# Patient Record
Sex: Female | Born: 1960 | Race: White | Hispanic: No | Marital: Married | Smoking: Never smoker
Health system: Southern US, Community
[De-identification: ages and names within clinical notes are randomized; demographics above are authoritative.]

## PROBLEM LIST (undated history)

## (undated) DIAGNOSIS — T4145XA Adverse effect of unspecified anesthetic, initial encounter: Secondary | ICD-10-CM

## (undated) DIAGNOSIS — E039 Hypothyroidism, unspecified: Secondary | ICD-10-CM

## (undated) DIAGNOSIS — T8859XA Other complications of anesthesia, initial encounter: Secondary | ICD-10-CM

## (undated) DIAGNOSIS — G43909 Migraine, unspecified, not intractable, without status migrainosus: Secondary | ICD-10-CM

## (undated) HISTORY — DX: Hypothyroidism, unspecified: E03.9

## (undated) HISTORY — PX: KNEE SURGERY: SHX244

## (undated) HISTORY — DX: Migraine, unspecified, not intractable, without status migrainosus: G43.909

## (undated) HISTORY — PX: TOTAL ABDOMINAL HYSTERECTOMY: SHX209

## (undated) HISTORY — PX: PILONIDAL CYST EXCISION: SHX744

## (undated) HISTORY — PX: CHOLECYSTECTOMY: SHX55

---

## 2007-10-19 ENCOUNTER — Encounter (HOSPITAL_COMMUNITY): Admission: RE | Admit: 2007-10-19 | Discharge: 2007-11-18 | Payer: Self-pay | Admitting: Endocrinology

## 2011-07-14 ENCOUNTER — Other Ambulatory Visit: Payer: Self-pay | Admitting: Neurology

## 2011-07-20 ENCOUNTER — Ambulatory Visit
Admission: RE | Admit: 2011-07-20 | Discharge: 2011-07-20 | Disposition: A | Discharge status: Home / Self Care | Payer: No Typology Code available for payment source | Source: Ambulatory Visit | Attending: Neurology | Admitting: Neurology

## 2011-07-20 DIAGNOSIS — R519 Headache, unspecified: Secondary | ICD-10-CM

## 2011-10-24 ENCOUNTER — Encounter (INDEPENDENT_AMBULATORY_CARE_PROVIDER_SITE_OTHER): Payer: Self-pay | Admitting: *Deleted

## 2011-12-23 ENCOUNTER — Ambulatory Visit (INDEPENDENT_AMBULATORY_CARE_PROVIDER_SITE_OTHER): Payer: No Typology Code available for payment source | Admitting: Internal Medicine

## 2011-12-23 ENCOUNTER — Other Ambulatory Visit (INDEPENDENT_AMBULATORY_CARE_PROVIDER_SITE_OTHER): Payer: Self-pay | Admitting: *Deleted

## 2011-12-23 ENCOUNTER — Telehealth (INDEPENDENT_AMBULATORY_CARE_PROVIDER_SITE_OTHER): Payer: Self-pay | Admitting: *Deleted

## 2011-12-23 ENCOUNTER — Encounter (INDEPENDENT_AMBULATORY_CARE_PROVIDER_SITE_OTHER): Payer: Self-pay | Admitting: Internal Medicine

## 2011-12-23 VITALS — BP 94/60 | HR 72 | Temp 98.3°F | Ht 66.5 in | Wt 161.7 lb

## 2011-12-23 DIAGNOSIS — E038 Other specified hypothyroidism: Secondary | ICD-10-CM | POA: Insufficient documentation

## 2011-12-23 DIAGNOSIS — R198 Other specified symptoms and signs involving the digestive system and abdomen: Secondary | ICD-10-CM

## 2011-12-23 DIAGNOSIS — R194 Change in bowel habit: Secondary | ICD-10-CM | POA: Insufficient documentation

## 2011-12-23 DIAGNOSIS — Z1211 Encounter for screening for malignant neoplasm of colon: Secondary | ICD-10-CM

## 2011-12-23 DIAGNOSIS — G43909 Migraine, unspecified, not intractable, without status migrainosus: Secondary | ICD-10-CM | POA: Insufficient documentation

## 2011-12-23 DIAGNOSIS — R1013 Epigastric pain: Secondary | ICD-10-CM | POA: Insufficient documentation

## 2011-12-23 DIAGNOSIS — K6289 Other specified diseases of anus and rectum: Secondary | ICD-10-CM

## 2011-12-23 MED ORDER — PEG-KCL-NACL-NASULF-NA ASC-C 100 G PO SOLR: 1.0000 | Freq: Once | ORAL | Status: DC

## 2011-12-23 MED ORDER — SUCRALFATE 1 GM/10ML PO SUSP: 1.0000 g | Freq: Four times a day (QID) | ORAL | Status: DC

## 2011-12-23 NOTE — Telephone Encounter (Signed)
Patient needs movi prep 

## 2011-12-23 NOTE — Progress Notes (Signed)
Subjective:     Patient ID: Patricia Glover, female   DOB: 01-18-1961, 51 y.o.   MRN: [id number]  HPI Referral from Dr. Sherril Croon for blood in her stool and and abdominal pain.  She states she had bloody diarrhea Friday and Saturday. Stools are ribbony. Stools are in small amounts. She also c/o epigastric pain 1 week ago. She underwent a CT scan of abdomen and pelvis for her abdominal pain (please see below).  She was started on Cipro and Flagyl for possible diverticulitis.   She has been treated for an ulcer in the past.  She started Carafate and the Carafate but this has not helped her epigastric pain.  She has a burning sensation in her epigastric region.  She has not seen any blood recently.  On rectal exam by K. Hairfield FNP she had a bloody mucous drainage. Appetite good. She thinks she may have lost 3-4 pounds with this episode.  Lower abdominal pain when she bend over.   EGD over 20 yrs ago by Dr. Cleotis Nipper and was told her stomach was slow emptying. She has never undergone a colonoscopy in the past.   12/19/2011: WBC ct 5.7.   H and H 13.4 and 38.8, MCV 95.8.  12/19/2011 CT abdomen and pelvis with CM:  (bilateral lower abdominal pain and rectal bleeding).  Minimal patchy opacity in the left lower lobe, suspicious for minimal pneumonia.  No avute abnormality in the abdomen or pelvis. Stable tiny non obstructing left renal calculus.  She tells me her grandmother had either esophageal or stomach cancer.   Review of Systems see hpi  Current Outpatient Prescriptions  Medication Sig Dispense Refill  . ciprofloxacin (CIPRO) 500 MG tablet Take 500 mg by mouth 2 (two) times daily.      . cloNIDine (CATAPRES) 0.1 MG tablet Take 0.1 mg by mouth 2 (two) times daily.      . cyclobenzaprine (FLEXERIL) 10 MG tablet Take 10 mg by mouth at bedtime as needed and may repeat dose one time if needed.      Marland Kitchen dexlansoprazole (DEXILANT) 60 MG capsule Take 60 mg by mouth daily.      . divalproex (DEPAKOTE ER) 500 MG 24  hr tablet Take 500 mg by mouth daily.      . DULoxetine (CYMBALTA) 60 MG capsule Take 60 mg by mouth daily.      Marland Kitchen ketorolac (TORADOL) 10 MG tablet Take 10 mg by mouth every 6 (six) hours as needed.      Marland Kitchen levothyroxine (SYNTHROID, LEVOTHROID) 150 MCG tablet Take 150 mcg by mouth daily.      . metroNIDAZOLE (FLAGYL) 500 MG tablet Take 500 mg by mouth 3 (three) times daily.      Marland Kitchen oxyCODONE-acetaminophen (PERCOCET) 10-325 MG per tablet Take 1 tablet by mouth every 4 (four) hours as needed.      . sucralfate (CARAFATE) 1 GM/10ML suspension Take 10 mLs (1 g total) by mouth 4 (four) times daily.  420 mL  1  . SUMAtriptan (IMITREX) 100 MG tablet Take 100 mg by mouth every 2 (two) hours as needed.      Marland Kitchen DISCONTD: sucralfate (CARAFATE) 1 GM/10ML suspension Take 1 g by mouth 4 (four) times daily.      Marland Kitchen dexlansoprazole (DEXILANT) 60 MG capsule Take 60 mg by mouth daily.      . sucralfate (CARAFATE) 1 GM/10ML suspension Take 10 mLs (1 g total) by mouth 4 (four) times daily.  420 mL  1  Past Medical History  Diagnosis Date  . Hypothyroid   . Migraines    Past Surgical History  Procedure Date  . Pilonidal cyst excision     35 yrs ago  . Cholecystectomy   . Knee surgery   . Total abdominal hysterectomy    History   Social History  . Marital Status: Married    Spouse Name: N/A    Number of Children: N/A  . Years of Education: N/A   Occupational History  . Not on file.   Social History Main Topics  . Smoking status: Never Smoker   . Smokeless tobacco: Not on file  . Alcohol Use: No  . Drug Use: No  . Sexually Active: Not on file   Other Topics Concern  . Not on file   Social History Narrative  . No narrative on file   Family Status  Relation Status Death Age  . Mother Alive     Thyroid disease, diverticulitis, depression  . Sister Alive     Good health   Allergies  Allergen Reactions  . Demerol (Meperidine)   . Morphine And Related        Objective:   Physical  Exam Filed Vitals:   12/23/11 1117  Height: 5' 6.5" (1.689 m)  Weight: 161 lb 11.2 oz (73.347 kg)   Alert and oriented. Skin warm and dry. Oral mucosa is moist.   . Sclera anicteric, conjunctivae is pink. Thyroid not enlarged. No cervical lymphadenopathy. Lungs clear. Heart regular rate and rhythm.  Abdomen is soft. Bowel sounds are positive. No hepatomegaly. No abdominal masses felt. Slight epigastric tenderness and slight tenderness rt lower quadrant. No edema to lower extremities.       Assessment:    Rectal bleeding. Diverticulitis ruled out on CT.  Possible hemorrhoidal bleed. Colonic neoplasm needs to be ruled out or polyp.  Patient has never undergone a colonoscopy in the past. She also c/o epigastric pain and a burning sensation. She says the Carafate and Dexiilant helped some but she continues to have the burning sensation. PUD needs to be ruled out.    Plan:    EGD and colonoscopy with Dr. Karilyn Cota. The risks and benefits such as perforation, bleeding, and infection were reviewed with the patient and is agreeable.

## 2011-12-23 NOTE — Patient Instructions (Addendum)
Continue Carafate and Dexilant. EGD and colonoscopy with Dr. Karilyn Cota. Finish Cipro and Flagyl

## 2011-12-26 ENCOUNTER — Encounter (HOSPITAL_COMMUNITY): Payer: Self-pay | Admitting: Pharmacy Technician

## 2012-01-02 ENCOUNTER — Encounter (INDEPENDENT_AMBULATORY_CARE_PROVIDER_SITE_OTHER): Payer: Self-pay

## 2012-01-07 MED ORDER — SODIUM CHLORIDE 0.45 % IV SOLN
Freq: Once | INTRAVENOUS | Status: AC
  Administered 2012-01-08: 1000 mL via INTRAVENOUS

## 2012-01-08 ENCOUNTER — Encounter (HOSPITAL_COMMUNITY): Payer: Self-pay | Admitting: *Deleted

## 2012-01-08 ENCOUNTER — Encounter (HOSPITAL_COMMUNITY)
Admission: RE | Disposition: A | Discharge status: Home / Self Care | Payer: Self-pay | Source: Ambulatory Visit | Attending: Internal Medicine

## 2012-01-08 ENCOUNTER — Ambulatory Visit (HOSPITAL_COMMUNITY)
Admission: RE | Admit: 2012-01-08 | Discharge: 2012-01-08 | Disposition: A | Discharge status: Home / Self Care | Payer: PRIVATE HEALTH INSURANCE | Source: Ambulatory Visit | Attending: Internal Medicine | Admitting: Internal Medicine

## 2012-01-08 ENCOUNTER — Encounter (INDEPENDENT_AMBULATORY_CARE_PROVIDER_SITE_OTHER): Payer: Self-pay

## 2012-01-08 DIAGNOSIS — R197 Diarrhea, unspecified: Secondary | ICD-10-CM | POA: Insufficient documentation

## 2012-01-08 DIAGNOSIS — R1013 Epigastric pain: Secondary | ICD-10-CM

## 2012-01-08 DIAGNOSIS — K6289 Other specified diseases of anus and rectum: Secondary | ICD-10-CM

## 2012-01-08 DIAGNOSIS — R109 Unspecified abdominal pain: Secondary | ICD-10-CM | POA: Insufficient documentation

## 2012-01-08 DIAGNOSIS — K921 Melena: Secondary | ICD-10-CM

## 2012-01-08 DIAGNOSIS — K644 Residual hemorrhoidal skin tags: Secondary | ICD-10-CM

## 2012-01-08 DIAGNOSIS — Z8711 Personal history of peptic ulcer disease: Secondary | ICD-10-CM

## 2012-01-08 HISTORY — DX: Adverse effect of unspecified anesthetic, initial encounter: T41.45XA

## 2012-01-08 HISTORY — DX: Other complications of anesthesia, initial encounter: T88.59XA

## 2012-01-08 SURGERY — COLONOSCOPY WITH ESOPHAGOGASTRODUODENOSCOPY (EGD)
Anesthesia: Moderate Sedation

## 2012-01-08 MED ORDER — FENTANYL CITRATE 0.05 MG/ML IJ SOLN
INTRAMUSCULAR | Status: DC | PRN
  Administered 2012-01-08 (×3): 25 ug via INTRAVENOUS

## 2012-01-08 MED ORDER — MIDAZOLAM HCL 5 MG/5ML IJ SOLN
INTRAMUSCULAR | Status: AC
  Filled 2012-01-08: qty 10

## 2012-01-08 MED ORDER — STERILE WATER FOR IRRIGATION IR SOLN
Status: DC | PRN
  Administered 2012-01-08: 14:00:00

## 2012-01-08 MED ORDER — ALIGN PO CAPS: 1.0000 | ORAL_CAPSULE | Freq: Every day | ORAL | Status: AC

## 2012-01-08 MED ORDER — DICYCLOMINE HCL 10 MG PO CAPS: 10.0000 mg | ORAL_CAPSULE | Freq: Three times a day (TID) | ORAL | Status: DC

## 2012-01-08 MED ORDER — MIDAZOLAM HCL 5 MG/5ML IJ SOLN
INTRAMUSCULAR | Status: DC | PRN
  Administered 2012-01-08 (×2): 2 mg via INTRAVENOUS
  Administered 2012-01-08: 1 mg via INTRAVENOUS
  Administered 2012-01-08 (×2): 2 mg via INTRAVENOUS

## 2012-01-08 MED ORDER — FENTANYL CITRATE 0.05 MG/ML IJ SOLN
INTRAMUSCULAR | Status: AC
  Filled 2012-01-08: qty 2

## 2012-01-08 MED ORDER — BUTAMBEN-TETRACAINE-BENZOCAINE 2-2-14 % EX AERO
INHALATION_SPRAY | CUTANEOUS | Status: DC | PRN
  Administered 2012-01-08: 2 via TOPICAL

## 2012-01-08 NOTE — Op Note (Signed)
EGD AND COLONOSCOPY  PROCEDURE REPORT  PATIENT:  Patricia Glover  MR#:  [id number] Birthdate:  Dec 07, 1960, 51 y.o., female Endoscopist:  Dr. Malissa Hippo, MD Referred By:  Dr. Ignatius Specking, MD Procedure Date: 01/08/2012  Procedure:   EGD & Colonoscopy  Indications:  Patient is 51 year old Caucasian female with recurrent upper and lower abdominal pain was not responded therapy. He had negative abdominopelvic CT. She's had prior cholecystectomy. She also gives history of dementing diarrhea and on one occasions she had 3 bloody bowel movements. She is undergoing diagnostic evaluation.            Informed Consent:  The risks, benefits, alternatives & imponderables which include, but are not limited to, bleeding, infection, perforation, drug reaction and potential missed lesion have been reviewed.  The potential for biopsy, lesion removal, esophageal dilation, etc. have also been discussed.  Questions have been answered.  All parties agreeable.  Please see history & physical in medical record for more information.  Medications:  Fentanyl 75 mcg  IV Versed 9 mg IV Cetacaine spray topically for oropharyngeal anesthesia  EGD  Description of procedure:  The endoscope was introduced through the mouth and advanced to the second portion of the duodenum without difficulty or limitations. The mucosal surfaces were surveyed very carefully during advancement of the scope and upon withdrawal.  Findings:  Esophagus:  Mucosa of the esophagus was normal. GE junction was unremarkable. GEJ:  40 cm Stomach:  Stomach was empty and distended very well with insufflation. Folds in the proximal stomach were normal. Examination of mucosa at body, antrum, pyloric channel, angularis fundus and cardia was normal. Duodenum:  Normal bulbar and post bulbar mucosa. Ampulla of water was also seen and appear to be normal.  Therapeutic/Diagnostic Maneuvers Performed:  None  COLONOSCOPY Description of procedure:  After a  digital rectal exam was performed, that colonoscope was advanced from the anus through the rectum and colon to the area of the cecum, ileocecal valve and appendiceal orifice. The cecum was deeply intubated. These structures were well-seen and photographed for the record. From the level of the cecum and ileocecal valve, the scope was slowly and cautiously withdrawn. The mucosal surfaces were carefully surveyed utilizing scope tip to flexion to facilitate fold flattening as needed. The scope was pulled down into the rectum where a thorough exam including retroflexion was performed. Terminal ileum was also examined.  Findings:   Prep excellent. Normal terminal ileum. Normal colonic mucosa throughout without evidence of diverticula colitis or polyps. Normal rectal mucosa. Small external hemorrhoids.  Therapeutic/Diagnostic Maneuvers Performed:  None  Complications:  None  Cecal Withdrawal Time:  8 minutes  Impression:  Normal esophagogastroduodenoscopy. Normal terminal ileoscopy and colonoscopy except external hemorrhoids.  Recommendations:  Dicyclomine 10 mg by mouth before each meal. Align 1 capsule by mouth daily. Small bowel follow-through.  Lisanne Ponce U  01/08/2012 2:09 PM  CC: Dr. Ignatius Specking., MD & Dr. Bonnetta Barry ref. provider found

## 2012-01-08 NOTE — H&P (Signed)
Patricia Glover is an 51 y.o. female.   Chief Complaint: Patient is here for esophagogastroduodenoscopy followed by colonoscopy. HPI: Patient is 51 year old Caucasian female who's been experiencing upper Patricia Glover loss few months. She had abdominopelvic CT which is unremarkable. She results of value in her gynecologist in evaluation is negative. She's been treated with PPI for over a month but did not feel any better. She has noted some relief of Carafate. She also gives history of rectal bleeding and diarrhea which lasted 1 day. She had 3 episodes of bloody bowel movements. She takes Toradol occasionally for migraine.  Past Medical History  Diagnosis Date  . Hypothyroid   . Migraines   . Complication of anesthesia     nausea, vomiting    Past Surgical History  Procedure Date  . Pilonidal cyst excision     35 yrs ago  . Cholecystectomy   . Knee surgery   . Total abdominal hysterectomy     History reviewed. No pertinent family history. Social History:  reports that she has never smoked. She does not have any smokeless tobacco history on file. She reports that she drinks alcohol. She reports that she does not use illicit drugs.  Allergies:  Allergies  Allergen Reactions  . Demerol (Meperidine) Nausea And Vomiting  . Morphine And Related Nausea And Vomiting    Medications Prior to Admission  Medication Sig Dispense Refill  . cloNIDine (CATAPRES) 0.1 MG tablet Take 0.1 mg by mouth at bedtime.       . divalproex (DEPAKOTE) 250 MG DR tablet Take 500-750 mg by mouth 2 (two) times daily. Take 2 tablets in the morning and 3 tablets in the evening      . DULoxetine (CYMBALTA) 60 MG capsule Take 60 mg by mouth every morning.       Marland Kitchen ketorolac (TORADOL) 10 MG tablet Take 10 mg by mouth every 6 (six) hours as needed. For migraines      . levothyroxine (SYNTHROID, LEVOTHROID) 150 MCG tablet Take 150 mcg by mouth daily before breakfast.       . Melatonin 5 MG TABS Take 1 tablet by mouth at  bedtime.      . ondansetron (ZOFRAN) 4 MG tablet Take 4 mg by mouth every 8 (eight) hours as needed. For nausea with migraines      . oxyCODONE-acetaminophen (PERCOCET) 10-325 MG per tablet Take 1 tablet by mouth every 4 (four) hours as needed. For severe migraines      . peg 3350 powder (MOVIPREP) 100 G SOLR Take 1 kit (100 g total) by mouth once.  1 kit  0  . sucralfate (CARAFATE) 1 GM/10ML suspension Take 10 mLs (1 g total) by mouth 4 (four) times daily.  420 mL  1  . SUMAtriptan (IMITREX) 100 MG tablet Take 100 mg by mouth every 2 (two) hours as needed. For headaches      . SUMAtriptan (IMITREX) 6 MG/0.5ML SOLN injection Inject 6 mg into the skin every 2 (two) hours as needed. For migraines        No results found for this or any previous visit (from the past 48 hour(s)). No results found.  ROS  Blood pressure 151/94, pulse 72, temperature 97.8 F (36.6 C), temperature source Oral, resp. rate 19, SpO2 97.00%. Physical Exam  Constitutional: She appears well-developed and well-nourished.  HENT:  Mouth/Throat: Oropharynx is clear and moist.  Eyes: Conjunctivae are normal. No scleral icterus.  Neck: No thyromegaly present.  Cardiovascular: Normal rate, regular  rhythm and normal heart sounds.   No murmur heard. Respiratory: Effort normal and breath sounds normal.  GI: She exhibits no distension and no mass. There is tenderness (mild tenderness at right mid abdomen).  Musculoskeletal: She exhibits no edema.  Lymphadenopathy:    She has no cervical adenopathy.  Neurological: She is alert.  Skin: Skin is warm.     Assessment/Plan Upper and lower abdominal pain. History of for bloody diarrhea. Diagnostic EGD and colonoscopy.  Patricia Glover 01/08/2012, 1:25 PM

## 2012-02-27 DIAGNOSIS — R079 Chest pain, unspecified: Secondary | ICD-10-CM

## 2012-12-20 ENCOUNTER — Other Ambulatory Visit: Payer: Self-pay | Admitting: *Deleted

## 2012-12-20 MED ORDER — LEVOTHYROXINE SODIUM 150 MCG PO TABS: 150.0000 ug | ORAL_TABLET | Freq: Every day | ORAL | Status: DC

## 2013-01-04 ENCOUNTER — Other Ambulatory Visit: Payer: Self-pay | Admitting: *Deleted

## 2013-01-04 DIAGNOSIS — E039 Hypothyroidism, unspecified: Secondary | ICD-10-CM

## 2013-01-12 ENCOUNTER — Ambulatory Visit: Payer: PRIVATE HEALTH INSURANCE | Admitting: Endocrinology

## 2013-01-20 ENCOUNTER — Ambulatory Visit: Payer: PRIVATE HEALTH INSURANCE | Admitting: Endocrinology

## 2013-01-20 DIAGNOSIS — Z0289 Encounter for other administrative examinations: Secondary | ICD-10-CM

## 2013-03-31 ENCOUNTER — Other Ambulatory Visit: Payer: Self-pay | Admitting: Endocrinology

## 2013-04-04 ENCOUNTER — Telehealth: Payer: Self-pay | Admitting: Endocrinology

## 2013-04-04 NOTE — Telephone Encounter (Signed)
Faxed again for the second time.

## 2013-04-13 ENCOUNTER — Other Ambulatory Visit: Payer: Self-pay | Admitting: *Deleted

## 2013-04-13 ENCOUNTER — Ambulatory Visit (INDEPENDENT_AMBULATORY_CARE_PROVIDER_SITE_OTHER): Payer: PRIVATE HEALTH INSURANCE | Admitting: Endocrinology

## 2013-04-13 ENCOUNTER — Encounter: Payer: Self-pay | Admitting: Endocrinology

## 2013-04-13 VITALS — BP 138/90 | HR 66 | Temp 98.3°F | Resp 12 | Ht 66.0 in | Wt 171.2 lb

## 2013-04-13 DIAGNOSIS — L748 Other eccrine sweat disorders: Secondary | ICD-10-CM

## 2013-04-13 DIAGNOSIS — L749 Eccrine sweat disorder, unspecified: Secondary | ICD-10-CM | POA: Insufficient documentation

## 2013-04-13 DIAGNOSIS — L639 Alopecia areata, unspecified: Secondary | ICD-10-CM

## 2013-04-13 DIAGNOSIS — R635 Abnormal weight gain: Secondary | ICD-10-CM

## 2013-04-13 DIAGNOSIS — E038 Other specified hypothyroidism: Secondary | ICD-10-CM

## 2013-04-13 MED ORDER — CLONIDINE HCL 0.1 MG PO TABS: ORAL_TABLET | ORAL | Status: DC

## 2013-04-13 NOTE — Progress Notes (Signed)
Patient ID: Patricia Glover, female   DOB: [birth year], 52 y.o.   MRN: [id number]  Reason for Appointment:  Hypothyroidism, followup visit    History of Present Illness:   The hypothyroidism was first diagnosed in 2008  Initially had symptoms of weight gain of 15 pounds, increasing fatigue as well as some hoarseness and difficulty swallowing. She did not have any cold intolerance; does have chronic dry skin. She was diagnosed with secondary hypothyroidism because of low free T4 and normal TSH, baseline levels not available She was initially treated with Armour Thyroid, subsequently Synthroid and Cytomel and the last few years with Synthroid alone Generally has had good response to her treatment  Complaints are reported by the patient now are  hair loss for the last 4-5 months which is patchy and also associated some loss of eyebrows. Hair loss is somewhat intermittent  She is also very concerned about her difficulty losing weight. She thinks she has gained another 5-6 pounds in the last 3 months even after stopping her Depakote; had been gaining weight on this    Walks daily and she thinks her diet is fairly good  She also has intermittent fatigue but no cold sensitivity. Has some dry skin and splitting nails          The treatments that the patient has taken include Synthroid.          Compliance with the medical regimen has been as prescribed with taking the tablet in the morning before breakfast.  LABS: From her local lab free T4 is 1.1, upper limit 1.12  No results found for any previous visit.    Medication List       This list is accurate as of: 04/13/13  9:39 PM.  Always use your most recent med list.               cloNIDine 0.1 MG tablet  Commonly known as:  CATAPRES  Take 2 tablets at bedtime     divalproex 250 MG DR tablet  Commonly known as:  DEPAKOTE  Take 500-750 mg by mouth 2 (two) times daily. Take 2 tablets in the morning and 3 tablets in the evening      DULoxetine 60 MG capsule  Commonly known as:  CYMBALTA  Take 60 mg by mouth every morning.     ketorolac 10 MG tablet  Commonly known as:  TORADOL  Take 10 mg by mouth every 6 (six) hours as needed. For migraines     levETIRAcetam 750 MG tablet  Commonly known as:  KEPPRA  Take 750 mg by mouth every 12 (twelve) hours. 750 mg at night and 500 mg in the am     levothyroxine 150 MCG tablet  Commonly known as:  SYNTHROID, LEVOTHROID  TAKE 1 TABLET BY MOUTH DAILY BEFORE BREAKFAST     Melatonin 5 MG Tabs  Take 1 tablet by mouth at bedtime.     ondansetron 4 MG tablet  Commonly known as:  ZOFRAN  Take 4 mg by mouth every 8 (eight) hours as needed. For nausea with migraines     oxyCODONE-acetaminophen 10-325 MG per tablet  Commonly known as:  PERCOCET  Take 1 tablet by mouth every 4 (four) hours as needed. For severe migraines     peg 3350 powder 100 G Solr  Commonly known as:  MOVIPREP  Take 1 kit (100 g total) by mouth once.     sucralfate 1 GM/10ML suspension  Commonly known as:  CARAFATE  Take 10 mLs (1 g total) by mouth 4 (four) times daily.     SUMAtriptan 100 MG tablet  Commonly known as:  IMITREX  Take 100 mg by mouth every 2 (two) hours as needed. For headaches     SUMAtriptan 6 MG/0.5ML Soln injection  Commonly known as:  IMITREX  Inject 6 mg into the skin every 2 (two) hours as needed. For migraines        Allergies:  Allergies  Allergen Reactions  . Demerol [Meperidine] Nausea And Vomiting  . Morphine And Related Nausea And Vomiting    Past Medical History  Diagnosis Date  . Hypothyroid   . Migraines   . Complication of anesthesia     nausea, vomiting    Past Surgical History  Procedure Laterality Date  . Pilonidal cyst excision      35 yrs ago  . Cholecystectomy    . Knee surgery    . Total abdominal hysterectomy      No family history on file.  Social History:  reports that she has never smoked. She does not have any smokeless tobacco  history on file. She reports that she drinks alcohol. She reports that she does not use illicit drugs.  REVIEW Of SYSTEMS:    She has hot flushes and night sweats despite using clonidine 0.1 mg. She is having some sweating spells in the daytime also   Migraines now better controlled with using Keppra   BP at work varies, diastolic 75-82, higher with stress. Taking clonidine for hot flashes other than blood pressure   No history of diabetes   She has borderline vitamin D level    Examination:   BP 138/90  Pulse 66  Temp(Src) 98.3 F (36.8 C)  Resp 12  Ht 5\' 6"  (1.676 m)  Wt 171 lb 3.2 oz (77.656 kg)  BMI 27.65 kg/m2  SpO2 96%   GENERAL APPEARANCE:  well-looking. No  Alopecia evident Skin is normal, her nails appear fairly normal  FACE: No puffiness of face or hands      NECK: no thyromegaly.          NEUROLOGIC EXAM: DTRs 2+ bilaterally at biceps.    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 She should talk to a dermatologist about this  HOT flashes/sweating: She is having more symptoms at night and can try a higher dose of clonidine at bedtime since he tends to have high normal blood pressure readings   Treatment:   Continue same dosage before breakfast daily. Avoid taking any calcium or iron supplements with the thyroid supplement.   Increase clonidine to 0.2 mg at bedtime    Followup with neurologist and PCP as above  Additional 1000 units of vitamin D 3  daily  Takuma Cifelli 04/13/2013, 9:39 PM

## 2013-04-13 NOTE — Patient Instructions (Addendum)
Try Qsymia   Try 2 clonidine at night

## 2013-05-19 ENCOUNTER — Encounter: Payer: Self-pay | Admitting: Endocrinology

## 2013-06-02 ENCOUNTER — Other Ambulatory Visit: Payer: Self-pay | Admitting: *Deleted

## 2013-06-02 MED ORDER — LEVOTHYROXINE SODIUM 150 MCG PO TABS: 150.0000 ug | ORAL_TABLET | Freq: Every day | ORAL | Status: DC

## 2013-09-30 ENCOUNTER — Other Ambulatory Visit: Payer: Self-pay | Admitting: *Deleted

## 2013-09-30 MED ORDER — CLONIDINE HCL 0.1 MG PO TABS: ORAL_TABLET | ORAL | Status: DC

## 2013-09-30 MED ORDER — LEVOTHYROXINE SODIUM 150 MCG PO TABS
150.0000 ug | ORAL_TABLET | Freq: Every day | ORAL | Status: DC
Start: 2013-09-30 — End: 2013-12-27

## 2013-12-27 ENCOUNTER — Other Ambulatory Visit: Payer: Self-pay | Admitting: *Deleted

## 2013-12-27 MED ORDER — LEVOTHYROXINE SODIUM 150 MCG PO TABS: 150.0000 ug | ORAL_TABLET | Freq: Every day | ORAL | Status: DC

## 2013-12-27 MED ORDER — CLONIDINE HCL 0.1 MG PO TABS: ORAL_TABLET | ORAL | Status: DC

## 2014-02-01 ENCOUNTER — Other Ambulatory Visit: Payer: Self-pay | Admitting: Endocrinology

## 2014-02-17 ENCOUNTER — Encounter: Payer: Self-pay | Admitting: Endocrinology

## 2014-02-17 ENCOUNTER — Ambulatory Visit (INDEPENDENT_AMBULATORY_CARE_PROVIDER_SITE_OTHER): Payer: PRIVATE HEALTH INSURANCE | Admitting: Endocrinology

## 2014-02-17 VITALS — BP 122/78 | HR 88 | Temp 98.5°F | Resp 12 | Wt 175.6 lb

## 2014-02-17 DIAGNOSIS — L748 Other eccrine sweat disorders: Secondary | ICD-10-CM

## 2014-02-17 DIAGNOSIS — E038 Other specified hypothyroidism: Secondary | ICD-10-CM

## 2014-02-17 DIAGNOSIS — L749 Eccrine sweat disorder, unspecified: Secondary | ICD-10-CM

## 2014-02-17 NOTE — Progress Notes (Signed)
Patient ID: Patricia Glover, female   DOB: [birth year], 53 y.o.   MRN: [id number]   Reason for Appointment:  Hypothyroidism, followup visit    History of Present Illness:   The hypothyroidism was first diagnosed in 2008  Initially had symptoms of weight gain of 15 pounds, increasing fatigue as well as some hoarseness and difficulty swallowing. She did not have any cold intolerance; does have chronic dry skin. She was diagnosed with secondary hypothyroidism because of low free T4 and normal TSH, baseline levels not available She was initially treated with Armour Thyroid, subsequently Synthroid and Cytomel and the last few years with Synthroid alone Generally has had good response to her treatment  Recent history:  Complaints are reported by the patient now are none She did have some difficulty with hair loss previously but this is better now without any intervention She has had some stress being a caregiver and does get some fatigue at times Overall feels fairly good She does have some difficulty losing weight but this is not new  She also has no cold sensitivity.  Hot at times, probably related to menopausal symptoms     She is quite compliant with taking her Synthroid in the morning consistently No recent labs available  Her previous free T4 = 1.1, upper limit 1.12  No results found for any previous visit.    Medication List       This list is accurate as of: 02/17/14  4:22 PM.  Always use your most recent med list.               cloNIDine 0.1 MG tablet  Commonly known as:  CATAPRES  TAKE 2 TABLETS BY MOUTH AT BEDTIME - ONE MONTH REFILL ONLY. PATIENT PAST DUE FOR OFFICE VISIT     divalproex 250 MG DR tablet  Commonly known as:  DEPAKOTE  Take 500-750 mg by mouth 2 (two) times daily. Take 2 tablets in the morning and 3 tablets in the evening     DULoxetine 60 MG capsule  Commonly known as:  CYMBALTA  Take 60 mg by mouth every morning.     ketorolac 10 MG tablet   Commonly known as:  TORADOL  Take 10 mg by mouth every 6 (six) hours as needed. For migraines     levETIRAcetam 750 MG tablet  Commonly known as:  KEPPRA  Take 750 mg by mouth every 12 (twelve) hours. 750 mg at night and 500 mg in the am     levothyroxine 150 MCG tablet  Commonly known as:  SYNTHROID, LEVOTHROID  TAKE 1 TABLET BY MOUTH EVERY DAY BEFORE BREAKFAST - ONE MONTH REFILL ONLY, PATIENT PAST DUE FOR OFFICE VISIT     Melatonin 5 MG Tabs  Take 1 tablet by mouth at bedtime.     ondansetron 4 MG tablet  Commonly known as:  ZOFRAN  Take 4 mg by mouth every 8 (eight) hours as needed. For nausea with migraines     oxyCODONE-acetaminophen 10-325 MG per tablet  Commonly known as:  PERCOCET  Take 1 tablet by mouth every 4 (four) hours as needed. For severe migraines     peg 3350 powder 100 G Solr  Commonly known as:  MOVIPREP  Take 1 kit (100 g total) by mouth once.     sucralfate 1 GM/10ML suspension  Commonly known as:  CARAFATE  Take 10 mLs (1 g total) by mouth 4 (four) times daily.     SUMAtriptan 100 MG tablet  Commonly known as:  IMITREX  Take 100 mg by mouth every 2 (two) hours as needed. For headaches     SUMAtriptan 6 MG/0.5ML Soln injection  Commonly known as:  IMITREX  Inject 6 mg into the skin every 2 (two) hours as needed. For migraines        Allergies:  Allergies  Allergen Reactions  . Demerol [Meperidine] Nausea And Vomiting  . Morphine And Related Nausea And Vomiting    Past Medical History  Diagnosis Date  . Hypothyroid   . Migraines   . Complication of anesthesia     nausea, vomiting    Past Surgical History  Procedure Laterality Date  . Pilonidal cyst excision      35 yrs ago  . Cholecystectomy    . Knee surgery    . Total abdominal hysterectomy      History reviewed. No pertinent family history.  Social History:  reports that she has never smoked. She does not have any smokeless tobacco history on file. She reports that she  drinks alcohol. She reports that she does not use illicit drugs.  REVIEW Of SYSTEMS:    She has hot flushes and night sweats which are better since her last visit with using clonidine 0.2 mg now. She is having less sweating spells in the daytime also   Migraines now better controlled with using Keppra   BP at work normal usually, higher with stress. Taking clonidine for hot flashes other than blood pressure   No history of diabetes  Additional 1000 units of vitamin D 3 daily  recommended on her last visit  Weight history:  Wt Readings from Last 3 Encounters:  02/17/14 175 lb 9.6 oz (79.652 kg)  04/13/13 171 lb 3.2 oz (77.656 kg)  12/23/11 161 lb 11.2 oz (73.347 kg)      Examination:   BP 122/78  Pulse 88  Temp(Src) 98.5 F (36.9 C) (Oral)  Resp 12  Wt 175 lb 9.6 oz (79.652 kg)  SpO2 97%   GENERAL APPEARANCE: She looks well Skin is normal, no peripheral edema  FACE: No puffiness of face or hands      NECK: no thyromegaly.          NEUROLOGIC EXAM: DTRs 2+ bilaterally at biceps.    Assessments   Hypothyroidism, secondary with long-term replacement on  regimen of 150 mcg She is symptomatically doing fairly well and is compliant with her supplement  HOT flashes/sweating: She is having good control of the symptoms at night with 0.2 mg clonidine and is tolerating this well  Weight gain: She has not been noted to be compliant with her exercise and diet regimen and will resume   Treatment:   Check free T4 level to decide on Synthroid doses Continue current does of clonidine at night  Benelli Winther 02/17/2014, 4:22 PM

## 2014-02-21 LAB — T4, FREE: FREE T4: 1.16 ng/dL (ref 0.60–1.60)

## 2014-02-21 NOTE — Progress Notes (Signed)
Quick Note:  Please let patient know that the lab result is normal and no change needed ______ 

## 2014-02-27 ENCOUNTER — Other Ambulatory Visit: Payer: Self-pay | Admitting: *Deleted

## 2014-02-27 MED ORDER — CLONIDINE HCL 0.1 MG PO TABS: ORAL_TABLET | ORAL | Status: DC

## 2014-03-01 ENCOUNTER — Other Ambulatory Visit: Payer: Self-pay | Admitting: *Deleted

## 2014-03-01 MED ORDER — LEVOTHYROXINE SODIUM 150 MCG PO TABS: ORAL_TABLET | ORAL | Status: DC

## 2014-06-26 ENCOUNTER — Other Ambulatory Visit: Payer: Self-pay | Admitting: *Deleted

## 2014-06-26 MED ORDER — CLONIDINE HCL 0.1 MG PO TABS: ORAL_TABLET | ORAL | Status: DC

## 2014-12-28 ENCOUNTER — Other Ambulatory Visit: Payer: Self-pay | Admitting: *Deleted

## 2014-12-28 MED ORDER — CLONIDINE HCL 0.1 MG PO TABS: ORAL_TABLET | ORAL | Status: DC

## 2015-02-22 ENCOUNTER — Other Ambulatory Visit: Payer: Self-pay | Admitting: *Deleted

## 2015-02-22 MED ORDER — CLONIDINE HCL 0.1 MG PO TABS: ORAL_TABLET | ORAL | Status: DC

## 2015-02-22 MED ORDER — LEVOTHYROXINE SODIUM 150 MCG PO TABS: ORAL_TABLET | ORAL | Status: DC

## 2015-03-26 ENCOUNTER — Other Ambulatory Visit: Payer: Self-pay | Admitting: *Deleted

## 2016-02-21 ENCOUNTER — Ambulatory Visit (INDEPENDENT_AMBULATORY_CARE_PROVIDER_SITE_OTHER): Payer: PRIVATE HEALTH INSURANCE | Admitting: Otolaryngology

## 2016-02-21 DIAGNOSIS — K219 Gastro-esophageal reflux disease without esophagitis: Secondary | ICD-10-CM | POA: Diagnosis not present

## 2016-02-21 DIAGNOSIS — R490 Dysphonia: Secondary | ICD-10-CM

## 2019-05-31 ENCOUNTER — Other Ambulatory Visit: Payer: Self-pay

## 2019-05-31 ENCOUNTER — Ambulatory Visit: Payer: PRIVATE HEALTH INSURANCE | Attending: Internal Medicine

## 2019-05-31 DIAGNOSIS — Z20822 Contact with and (suspected) exposure to covid-19: Secondary | ICD-10-CM

## 2019-06-01 LAB — NOVEL CORONAVIRUS, NAA: SARS-CoV-2, NAA: NOT DETECTED

## 2019-10-31 ENCOUNTER — Other Ambulatory Visit: Payer: Self-pay | Admitting: Nurse Practitioner

## 2019-11-01 ENCOUNTER — Other Ambulatory Visit: Payer: Self-pay | Admitting: Nurse Practitioner

## 2019-12-15 ENCOUNTER — Ambulatory Visit (INDEPENDENT_AMBULATORY_CARE_PROVIDER_SITE_OTHER): Payer: 59

## 2019-12-15 ENCOUNTER — Ambulatory Visit
Admission: EM | Admit: 2019-12-15 | Discharge: 2019-12-15 | Disposition: A | Discharge status: Home / Self Care | Payer: 59

## 2019-12-15 DIAGNOSIS — S93491A Sprain of other ligament of right ankle, initial encounter: Secondary | ICD-10-CM

## 2019-12-15 DIAGNOSIS — M25571 Pain in right ankle and joints of right foot: Secondary | ICD-10-CM

## 2019-12-15 MED ORDER — TRAMADOL HCL 50 MG PO TABS: 50.0000 mg | ORAL_TABLET | Freq: Two times a day (BID) | ORAL | 0 refills | Status: DC | PRN

## 2019-12-15 NOTE — Discharge Instructions (Addendum)
X-rays negative for fracture or dislocation Continue conservative management of rest, ice, and elevate Cam walker applied Alternate ibuprofen and tylenol Tramadol for severe break-through pain.  DO NOT DRIVE OR OPERATE HEAVY MACHINERY WHILE TAKING as this medication may make you drowsy Follow up with PCP if symptoms persist Return or go to the ER if you have any new or worsening symptoms (fever, chills, chest pain, redness, swelling, bruising, deformity, worsening symptoms despite treatment, etc...)
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
## 2019-12-15 NOTE — ED Triage Notes (Signed)
Pt presents with right ankle  injury after stepping off steps last night, swelling noted

## 2020-02-21 ENCOUNTER — Other Ambulatory Visit: Payer: Self-pay | Admitting: Nurse Practitioner

## 2020-02-21 DIAGNOSIS — R32 Unspecified urinary incontinence: Secondary | ICD-10-CM | POA: Diagnosis not present

## 2020-02-21 DIAGNOSIS — Z1331 Encounter for screening for depression: Secondary | ICD-10-CM | POA: Diagnosis not present

## 2020-02-21 DIAGNOSIS — Z299 Encounter for prophylactic measures, unspecified: Secondary | ICD-10-CM | POA: Diagnosis not present

## 2020-02-21 DIAGNOSIS — Z0000 Encounter for general adult medical examination without abnormal findings: Secondary | ICD-10-CM | POA: Diagnosis not present

## 2020-02-21 DIAGNOSIS — I10 Essential (primary) hypertension: Secondary | ICD-10-CM | POA: Diagnosis not present

## 2020-02-21 DIAGNOSIS — Z6829 Body mass index (BMI) 29.0-29.9, adult: Secondary | ICD-10-CM | POA: Diagnosis not present

## 2020-02-21 DIAGNOSIS — E039 Hypothyroidism, unspecified: Secondary | ICD-10-CM | POA: Diagnosis not present

## 2020-02-21 DIAGNOSIS — R197 Diarrhea, unspecified: Secondary | ICD-10-CM | POA: Diagnosis not present

## 2020-02-22 DIAGNOSIS — E039 Hypothyroidism, unspecified: Secondary | ICD-10-CM | POA: Diagnosis not present

## 2020-02-22 DIAGNOSIS — Z0000 Encounter for general adult medical examination without abnormal findings: Secondary | ICD-10-CM | POA: Diagnosis not present

## 2020-02-22 DIAGNOSIS — Z79899 Other long term (current) drug therapy: Secondary | ICD-10-CM | POA: Diagnosis not present

## 2020-03-13 ENCOUNTER — Other Ambulatory Visit: Payer: Self-pay | Admitting: Nurse Practitioner

## 2020-04-10 ENCOUNTER — Other Ambulatory Visit: Payer: Self-pay | Admitting: Nurse Practitioner

## 2020-04-12 ENCOUNTER — Other Ambulatory Visit: Payer: Self-pay | Admitting: Nurse Practitioner

## 2020-04-24 ENCOUNTER — Other Ambulatory Visit: Payer: Self-pay

## 2020-04-24 ENCOUNTER — Ambulatory Visit: Payer: 59 | Attending: Internal Medicine | Admitting: Physical Therapy

## 2020-04-24 DIAGNOSIS — M79672 Pain in left foot: Secondary | ICD-10-CM | POA: Insufficient documentation

## 2020-04-24 NOTE — Therapy (Signed)
[facility] MAIN [facility] SERVICES [address] [place], [place], [address] Phone: [phone]   Fax:  [phone]  Physical Therapy Treatment  Patient Details  Name: Patricia Glover MRN: [id number] Date of Birth: [birth year] No data recorded  Encounter Date: 04/24/2020    Past Medical History:  Diagnosis Date  . Complication of anesthesia    nausea, vomiting  . Hypothyroid   . Migraines     Past Surgical History:  Procedure Laterality Date  . CHOLECYSTECTOMY    . KNEE SURGERY    . PILONIDAL CYST EXCISION     35 yrs ago  . TOTAL ABDOMINAL HYSTERECTOMY      There were no vitals filed for this visit.    PT/OT/SLP Screening Form   Time: in 1:15  Time out 1:45   Complaint _Patient foot hurts with a shoe on or if she is weight bearing or if she has been up on it. The pain began 2 weeks ago. It hurts after several hours of being up on her foot. Past Medical Hx: L ankle sprain many years ago  Injury Date:began 2 weeks ago Pain Scale: left ankle 6/10  Patient's phone number: [phone]  Hx (this occurrence): This patient reports that her left ankle started hurting 2 weeks ago from unknown origin. It hurts with walking/standing/or with a shoe on. She mostly stands for work.      Assessment: Tender to distal  5th metatarsal  L ankle DF weak -4/5 and painful, toe extension weak and tender ROM is WNL to left ankle       Recommendations:  Ice and MD referral  Comments:    [x]  Patient would benefit from an MD referral []  Patient would benefit from a full PT/OT/ SLP evaluation and treatment. [x]  No intervention recommended at this time.                                         Patient will benefit from skilled therapeutic intervention in order to improve the following deficits and impairments:     Visit Diagnosis: Pain in left foot     Problem List Patient Active Problem List    Diagnosis Date Noted  . Sweating abnormality 04/13/2013  . Alopecia areata 04/13/2013  . Epigastric pain 12/23/2011  . Encounter for diagnostic colonoscopy due to change in bowel habits 12/23/2011  . Migraines 12/23/2011  . 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

## 2020-05-25 ENCOUNTER — Ambulatory Visit: Payer: 59 | Attending: Internal Medicine

## 2020-05-25 DIAGNOSIS — Z23 Encounter for immunization: Secondary | ICD-10-CM

## 2020-05-25 NOTE — Progress Notes (Signed)
   Covid-19 Vaccination Clinic  Name:  MAYTHE DERAMO    MRN: [id number] DOB: Mar 29, 1961  05/25/2020  Ms. Smigelski was observed post Covid-19 immunization for 15 minutes without incident. She was provided with Vaccine Information Sheet and instruction to access the V-Safe system.   Ms. Avino was instructed to call 911 with any severe reactions post vaccine: Marland Kitchen Difficulty breathing  . Swelling of face and throat  . A fast heartbeat  . A bad rash all over body  . Dizziness and weakness   Immunizations Administered    No immunizations on file.

## 2020-05-31 DIAGNOSIS — F419 Anxiety disorder, unspecified: Secondary | ICD-10-CM | POA: Diagnosis not present

## 2020-05-31 DIAGNOSIS — Z299 Encounter for prophylactic measures, unspecified: Secondary | ICD-10-CM | POA: Diagnosis not present

## 2020-05-31 DIAGNOSIS — E039 Hypothyroidism, unspecified: Secondary | ICD-10-CM | POA: Diagnosis not present

## 2020-05-31 DIAGNOSIS — I10 Essential (primary) hypertension: Secondary | ICD-10-CM | POA: Diagnosis not present

## 2020-06-14 ENCOUNTER — Other Ambulatory Visit: Payer: Self-pay | Admitting: Internal Medicine

## 2020-06-14 DIAGNOSIS — Z299 Encounter for prophylactic measures, unspecified: Secondary | ICD-10-CM | POA: Diagnosis not present

## 2020-06-14 DIAGNOSIS — F419 Anxiety disorder, unspecified: Secondary | ICD-10-CM | POA: Diagnosis not present

## 2020-06-14 DIAGNOSIS — I10 Essential (primary) hypertension: Secondary | ICD-10-CM | POA: Diagnosis not present

## 2020-06-14 DIAGNOSIS — K219 Gastro-esophageal reflux disease without esophagitis: Secondary | ICD-10-CM | POA: Diagnosis not present

## 2020-06-14 DIAGNOSIS — R000 Tachycardia, unspecified: Secondary | ICD-10-CM | POA: Diagnosis not present

## 2020-08-02 ENCOUNTER — Other Ambulatory Visit: Payer: Self-pay | Admitting: Nurse Practitioner

## 2020-08-20 ENCOUNTER — Other Ambulatory Visit: Payer: Self-pay | Admitting: Nurse Practitioner

## 2020-08-20 DIAGNOSIS — Z1231 Encounter for screening mammogram for malignant neoplasm of breast: Secondary | ICD-10-CM

## 2020-09-26 ENCOUNTER — Other Ambulatory Visit: Payer: Self-pay

## 2020-09-26 MED FILL — Hydrochlorothiazide Tab 25 MG: ORAL | 30 days supply | Qty: 30 | Fill #0 | Status: AC

## 2020-09-26 MED FILL — Clonidine HCl Tab 0.1 MG: ORAL | 30 days supply | Qty: 60 | Fill #0 | Status: AC

## 2020-09-26 MED FILL — Losartan Potassium Tab 100 MG: ORAL | 30 days supply | Qty: 60 | Fill #0 | Status: AC

## 2020-09-26 MED FILL — Hydrochlorothiazide Tab 25 MG: ORAL | 30 days supply | Qty: 30 | Fill #0 | Status: CN

## 2020-09-26 MED FILL — Duloxetine HCl Enteric Coated Pellets Cap 60 MG (Base Eq): ORAL | 30 days supply | Qty: 90 | Fill #0 | Status: AC

## 2020-09-26 MED FILL — Levothyroxine Sodium Tab 88 MCG: ORAL | 90 days supply | Qty: 90 | Fill #0 | Status: AC

## 2020-10-16 ENCOUNTER — Other Ambulatory Visit: Payer: Self-pay

## 2020-10-16 ENCOUNTER — Ambulatory Visit
Admission: RE | Admit: 2020-10-16 | Discharge: 2020-10-16 | Disposition: A | Discharge status: Home / Self Care | Payer: 59 | Source: Ambulatory Visit | Attending: Nurse Practitioner | Admitting: Nurse Practitioner

## 2020-10-16 DIAGNOSIS — Z1231 Encounter for screening mammogram for malignant neoplasm of breast: Secondary | ICD-10-CM | POA: Diagnosis not present

## 2020-10-17 ENCOUNTER — Inpatient Hospital Stay
Admission: RE | Admit: 2020-10-17 | Discharge: 2020-10-17 | Disposition: A | Discharge status: Home / Self Care | Payer: Self-pay | Source: Ambulatory Visit | Attending: *Deleted | Admitting: *Deleted

## 2020-10-17 ENCOUNTER — Other Ambulatory Visit: Payer: Self-pay | Admitting: *Deleted

## 2020-10-17 DIAGNOSIS — Z299 Encounter for prophylactic measures, unspecified: Secondary | ICD-10-CM | POA: Diagnosis not present

## 2020-10-17 DIAGNOSIS — Z1231 Encounter for screening mammogram for malignant neoplasm of breast: Secondary | ICD-10-CM

## 2020-10-17 DIAGNOSIS — R5383 Other fatigue: Secondary | ICD-10-CM | POA: Diagnosis not present

## 2020-10-17 DIAGNOSIS — I10 Essential (primary) hypertension: Secondary | ICD-10-CM | POA: Diagnosis not present

## 2020-10-17 DIAGNOSIS — E039 Hypothyroidism, unspecified: Secondary | ICD-10-CM | POA: Diagnosis not present

## 2020-10-17 DIAGNOSIS — Z789 Other specified health status: Secondary | ICD-10-CM | POA: Diagnosis not present

## 2020-10-18 ENCOUNTER — Other Ambulatory Visit: Payer: Self-pay

## 2020-10-18 MED ORDER — LEVOTHYROXINE SODIUM 88 MCG PO TABS
88.0000 ug | ORAL_TABLET | Freq: Every day | ORAL | 2 refills | Status: DC
  Filled 2020-10-18 – 2021-01-03 (×3): qty 90, 90d supply, fill #0
  Filled 2021-04-11: qty 90, 90d supply, fill #1
  Filled 2021-07-11: qty 90, 90d supply, fill #2

## 2020-10-18 MED FILL — Metoprolol Succinate Tab ER 24HR 25 MG (Tartrate Equiv): ORAL | 30 days supply | Qty: 90 | Fill #0 | Status: AC

## 2020-11-06 ENCOUNTER — Other Ambulatory Visit: Payer: Self-pay

## 2020-11-06 MED ORDER — CLONIDINE HCL 0.1 MG PO TABS
ORAL_TABLET | ORAL | 2 refills | Status: DC
  Filled 2020-11-06: qty 60, 30d supply, fill #0
  Filled 2020-12-13: qty 60, 30d supply, fill #1
  Filled 2021-05-29: qty 60, 30d supply, fill #2

## 2020-11-06 MED ORDER — SCOPOLAMINE 1 MG/3DAYS TD PT72
MEDICATED_PATCH | TRANSDERMAL | 0 refills | Status: DC
  Filled 2020-11-06: qty 3, 9d supply, fill #0

## 2020-11-06 MED FILL — Metoprolol Succinate Tab ER 24HR 25 MG (Tartrate Equiv): ORAL | 30 days supply | Qty: 90 | Fill #1 | Status: AC

## 2020-11-06 MED FILL — Hydrochlorothiazide Tab 25 MG: ORAL | 30 days supply | Qty: 30 | Fill #1 | Status: AC

## 2020-11-06 MED FILL — Losartan Potassium Tab 100 MG: ORAL | 30 days supply | Qty: 60 | Fill #1 | Status: AC

## 2020-11-07 ENCOUNTER — Other Ambulatory Visit: Payer: Self-pay

## 2020-11-07 MED ORDER — DULOXETINE HCL 60 MG PO CPEP
ORAL_CAPSULE | ORAL | 1 refills | Status: DC
  Filled 2020-11-07: qty 90, 90d supply, fill #0
  Filled 2021-02-06: qty 90, 90d supply, fill #1

## 2020-11-13 ENCOUNTER — Other Ambulatory Visit: Payer: Self-pay

## 2020-12-13 ENCOUNTER — Other Ambulatory Visit: Payer: Self-pay | Admitting: Nurse Practitioner

## 2020-12-13 ENCOUNTER — Other Ambulatory Visit: Payer: Self-pay

## 2020-12-13 MED FILL — Losartan Potassium Tab 100 MG: ORAL | 30 days supply | Qty: 60 | Fill #2 | Status: AC

## 2020-12-13 MED FILL — Metoprolol Succinate Tab ER 24HR 25 MG (Tartrate Equiv): ORAL | 30 days supply | Qty: 90 | Fill #2 | Status: AC

## 2020-12-14 ENCOUNTER — Other Ambulatory Visit: Payer: Self-pay

## 2020-12-14 MED ORDER — CARESTART COVID-19 HOME TEST VI KIT
PACK | 0 refills | Status: DC
  Filled 2020-12-14: qty 2, 4d supply, fill #0

## 2020-12-18 ENCOUNTER — Other Ambulatory Visit: Payer: Self-pay

## 2020-12-19 ENCOUNTER — Other Ambulatory Visit: Payer: Self-pay

## 2020-12-20 ENCOUNTER — Other Ambulatory Visit: Payer: Self-pay

## 2020-12-20 MED ORDER — LOSARTAN POTASSIUM 100 MG PO TABS
ORAL_TABLET | ORAL | 12 refills | Status: DC
  Filled 2020-12-20: qty 60, 30d supply, fill #0
  Filled 2021-01-17: qty 60, 60d supply, fill #0
  Filled 2021-05-29: qty 60, 60d supply, fill #1
  Filled 2021-08-01: qty 60, 60d supply, fill #2
  Filled 2021-10-01: qty 60, 60d supply, fill #3
  Filled 2021-12-02: qty 60, 60d supply, fill #4

## 2020-12-20 MED ORDER — METOPROLOL SUCCINATE ER 25 MG PO TB24
ORAL_TABLET | ORAL | 6 refills | Status: DC
  Filled 2020-12-20 – 2021-01-17 (×2): qty 90, 30d supply, fill #0
  Filled 2021-03-26: qty 90, 30d supply, fill #1
  Filled 2021-04-29: qty 90, 30d supply, fill #2
  Filled 2021-05-29: qty 90, 30d supply, fill #3
  Filled 2021-06-26: qty 90, 30d supply, fill #4
  Filled 2021-08-01: qty 90, 30d supply, fill #5
  Filled 2021-10-01: qty 90, 30d supply, fill #6

## 2020-12-20 MED ORDER — HYDROCHLOROTHIAZIDE 25 MG PO TABS
ORAL_TABLET | ORAL | 3 refills | Status: DC
  Filled 2020-12-20: qty 30, 30d supply, fill #0
  Filled 2021-01-17: qty 30, 30d supply, fill #1
  Filled 2021-02-06: qty 30, 30d supply, fill #2
  Filled 2021-03-26: qty 30, 30d supply, fill #3

## 2021-01-03 ENCOUNTER — Other Ambulatory Visit: Payer: Self-pay

## 2021-01-17 ENCOUNTER — Other Ambulatory Visit: Payer: Self-pay

## 2021-01-18 ENCOUNTER — Other Ambulatory Visit: Payer: Self-pay

## 2021-01-18 MED ORDER — CLONIDINE HCL 0.1 MG PO TABS
ORAL_TABLET | ORAL | 2 refills | Status: DC
  Filled 2021-01-18: qty 60, 30d supply, fill #0
  Filled 2021-03-26: qty 60, 30d supply, fill #1
  Filled 2021-04-29: qty 60, 30d supply, fill #2

## 2021-02-06 ENCOUNTER — Other Ambulatory Visit: Payer: Self-pay

## 2021-02-06 MED FILL — Metoprolol Succinate Tab ER 24HR 25 MG (Tartrate Equiv): ORAL | 30 days supply | Qty: 90 | Fill #3 | Status: AC

## 2021-02-07 ENCOUNTER — Other Ambulatory Visit: Payer: Self-pay

## 2021-02-07 MED ORDER — SUMATRIPTAN SUCCINATE 100 MG PO TABS
ORAL_TABLET | ORAL | 5 refills | Status: AC
  Filled 2021-02-07: qty 9, 30d supply, fill #0
  Filled 2021-04-29: qty 9, 30d supply, fill #1
  Filled 2021-12-02: qty 9, 30d supply, fill #2
  Filled 2022-01-01: qty 9, 30d supply, fill #3

## 2021-02-11 ENCOUNTER — Other Ambulatory Visit: Payer: Self-pay

## 2021-03-26 ENCOUNTER — Other Ambulatory Visit: Payer: Self-pay

## 2021-04-11 ENCOUNTER — Other Ambulatory Visit: Payer: Self-pay

## 2021-04-29 ENCOUNTER — Other Ambulatory Visit: Payer: Self-pay

## 2021-04-29 MED ORDER — HYDROCHLOROTHIAZIDE 25 MG PO TABS
ORAL_TABLET | ORAL | 3 refills | Status: DC
  Filled 2021-04-29: qty 30, 30d supply, fill #0
  Filled 2021-06-26: qty 30, 30d supply, fill #1
  Filled 2021-08-01: qty 30, 30d supply, fill #2
  Filled 2021-10-01: qty 30, 30d supply, fill #3

## 2021-04-30 ENCOUNTER — Other Ambulatory Visit: Payer: Self-pay

## 2021-04-30 MED ORDER — HYDROCHLOROTHIAZIDE 25 MG PO TABS
ORAL_TABLET | ORAL | 0 refills | Status: DC
  Filled 2021-04-30 – 2021-05-29 (×2): qty 30, 30d supply, fill #0

## 2021-05-29 ENCOUNTER — Other Ambulatory Visit: Payer: Self-pay

## 2021-05-29 MED ORDER — DULOXETINE HCL 60 MG PO CPEP
ORAL_CAPSULE | ORAL | 3 refills | Status: DC
  Filled 2021-05-29: qty 90, 90d supply, fill #0
  Filled 2021-09-03: qty 90, 90d supply, fill #1
  Filled 2021-12-02: qty 90, 90d supply, fill #2
  Filled 2022-03-17: qty 90, 90d supply, fill #3
  Filled ????-??-??: fill #1

## 2021-06-26 ENCOUNTER — Other Ambulatory Visit: Payer: Self-pay

## 2021-06-27 ENCOUNTER — Other Ambulatory Visit: Payer: Self-pay

## 2021-06-27 MED ORDER — CLONIDINE HCL 0.1 MG PO TABS
ORAL_TABLET | ORAL | 2 refills | Status: DC
  Filled 2021-06-27: qty 60, 30d supply, fill #0
  Filled 2021-08-01: qty 60, 30d supply, fill #1
  Filled 2021-09-03: qty 60, 30d supply, fill #2
  Filled ????-??-??: fill #2

## 2021-07-11 ENCOUNTER — Other Ambulatory Visit: Payer: Self-pay

## 2021-08-01 ENCOUNTER — Other Ambulatory Visit: Payer: Self-pay

## 2021-09-01 ENCOUNTER — Other Ambulatory Visit: Payer: Self-pay

## 2021-09-02 ENCOUNTER — Other Ambulatory Visit: Payer: Self-pay

## 2021-09-03 ENCOUNTER — Other Ambulatory Visit: Payer: Self-pay

## 2021-09-03 MED ORDER — HYDROCHLOROTHIAZIDE 25 MG PO TABS
ORAL_TABLET | ORAL | 0 refills | Status: DC
  Filled 2021-09-03: qty 30, 30d supply, fill #0

## 2021-09-04 ENCOUNTER — Other Ambulatory Visit: Payer: Self-pay

## 2021-09-26 ENCOUNTER — Other Ambulatory Visit: Payer: Self-pay

## 2021-09-26 MED ORDER — PREDNISONE 5 MG PO TABS
ORAL_TABLET | ORAL | 0 refills | Status: DC
  Filled 2021-09-26: qty 21, 6d supply, fill #0

## 2021-10-01 ENCOUNTER — Other Ambulatory Visit: Payer: Self-pay

## 2021-10-01 MED ORDER — LEVOTHYROXINE SODIUM 88 MCG PO TABS
88.0000 ug | ORAL_TABLET | Freq: Every day | ORAL | 0 refills | Status: DC
  Filled 2021-10-01: qty 30, 30d supply, fill #0

## 2021-10-01 MED ORDER — CLONIDINE HCL 0.1 MG PO TABS
ORAL_TABLET | ORAL | 2 refills | Status: DC
  Filled 2021-10-01: qty 60, 30d supply, fill #0
  Filled 2021-10-29: qty 60, 30d supply, fill #1
  Filled 2022-08-29: qty 60, 30d supply, fill #2

## 2021-10-29 ENCOUNTER — Other Ambulatory Visit: Payer: Self-pay

## 2021-11-04 ENCOUNTER — Other Ambulatory Visit: Payer: Self-pay

## 2021-11-04 DIAGNOSIS — M25572 Pain in left ankle and joints of left foot: Secondary | ICD-10-CM | POA: Diagnosis not present

## 2021-11-04 DIAGNOSIS — Z6831 Body mass index (BMI) 31.0-31.9, adult: Secondary | ICD-10-CM | POA: Diagnosis not present

## 2021-11-04 DIAGNOSIS — I10 Essential (primary) hypertension: Secondary | ICD-10-CM | POA: Diagnosis not present

## 2021-11-04 DIAGNOSIS — Z299 Encounter for prophylactic measures, unspecified: Secondary | ICD-10-CM | POA: Diagnosis not present

## 2021-11-04 DIAGNOSIS — Z713 Dietary counseling and surveillance: Secondary | ICD-10-CM | POA: Diagnosis not present

## 2021-11-04 MED ORDER — HYDROCHLOROTHIAZIDE 25 MG PO TABS
ORAL_TABLET | ORAL | 2 refills | Status: DC
  Filled 2021-11-04: qty 30, 30d supply, fill #0
  Filled 2021-12-02: qty 30, 30d supply, fill #1
  Filled 2022-02-07: qty 30, 30d supply, fill #2

## 2021-11-04 MED ORDER — CLONIDINE HCL 0.1 MG PO TABS
ORAL_TABLET | ORAL | 2 refills | Status: DC
  Filled 2021-11-04 – 2021-12-02 (×2): qty 60, 30d supply, fill #0
  Filled 2022-01-01: qty 60, 30d supply, fill #1
  Filled 2022-02-07: qty 60, 30d supply, fill #2

## 2021-11-07 ENCOUNTER — Ambulatory Visit
Admission: RE | Admit: 2021-11-07 | Discharge: 2021-11-07 | Disposition: A | Discharge status: Home / Self Care | Payer: 59 | Source: Ambulatory Visit | Attending: Family Medicine | Admitting: Family Medicine

## 2021-11-07 ENCOUNTER — Ambulatory Visit
Admission: RE | Admit: 2021-11-07 | Discharge: 2021-11-07 | Disposition: A | Discharge status: Home / Self Care | Payer: 59 | Attending: Family Medicine | Admitting: Family Medicine

## 2021-11-07 ENCOUNTER — Other Ambulatory Visit: Payer: Self-pay | Admitting: Family Medicine

## 2021-11-07 DIAGNOSIS — M79672 Pain in left foot: Secondary | ICD-10-CM

## 2021-11-07 DIAGNOSIS — M25572 Pain in left ankle and joints of left foot: Secondary | ICD-10-CM | POA: Diagnosis not present

## 2021-11-07 DIAGNOSIS — M7989 Other specified soft tissue disorders: Secondary | ICD-10-CM | POA: Diagnosis not present

## 2021-11-14 DIAGNOSIS — M79672 Pain in left foot: Secondary | ICD-10-CM | POA: Diagnosis not present

## 2021-11-14 DIAGNOSIS — M67962 Unspecified disorder of synovium and tendon, left lower leg: Secondary | ICD-10-CM | POA: Diagnosis not present

## 2021-11-14 DIAGNOSIS — M25572 Pain in left ankle and joints of left foot: Secondary | ICD-10-CM | POA: Diagnosis not present

## 2021-11-19 DIAGNOSIS — M67962 Unspecified disorder of synovium and tendon, left lower leg: Secondary | ICD-10-CM | POA: Diagnosis not present

## 2021-11-19 DIAGNOSIS — M79672 Pain in left foot: Secondary | ICD-10-CM | POA: Diagnosis not present

## 2021-11-19 DIAGNOSIS — M6281 Muscle weakness (generalized): Secondary | ICD-10-CM | POA: Diagnosis not present

## 2021-11-21 DIAGNOSIS — M6281 Muscle weakness (generalized): Secondary | ICD-10-CM | POA: Diagnosis not present

## 2021-11-21 DIAGNOSIS — M79672 Pain in left foot: Secondary | ICD-10-CM | POA: Diagnosis not present

## 2021-11-21 DIAGNOSIS — M67962 Unspecified disorder of synovium and tendon, left lower leg: Secondary | ICD-10-CM | POA: Diagnosis not present

## 2021-11-26 DIAGNOSIS — M67962 Unspecified disorder of synovium and tendon, left lower leg: Secondary | ICD-10-CM | POA: Diagnosis not present

## 2021-12-02 ENCOUNTER — Other Ambulatory Visit: Payer: Self-pay

## 2021-12-03 ENCOUNTER — Other Ambulatory Visit: Payer: Self-pay

## 2021-12-03 MED ORDER — HYDROCHLOROTHIAZIDE 25 MG PO TABS
ORAL_TABLET | ORAL | 0 refills | Status: DC
  Filled 2021-12-03 – 2022-01-01 (×2): qty 30, 30d supply, fill #0

## 2021-12-03 MED ORDER — METOPROLOL SUCCINATE ER 25 MG PO TB24
ORAL_TABLET | ORAL | 0 refills | Status: DC
  Filled 2021-12-03: qty 90, 30d supply, fill #0

## 2021-12-03 MED ORDER — LEVOTHYROXINE SODIUM 88 MCG PO TABS
88.0000 ug | ORAL_TABLET | Freq: Every day | ORAL | 0 refills | Status: DC
  Filled 2021-12-03: qty 30, 30d supply, fill #0

## 2021-12-04 ENCOUNTER — Other Ambulatory Visit: Payer: Self-pay

## 2021-12-13 ENCOUNTER — Other Ambulatory Visit: Payer: Self-pay

## 2021-12-27 DIAGNOSIS — E039 Hypothyroidism, unspecified: Secondary | ICD-10-CM | POA: Diagnosis not present

## 2021-12-31 ENCOUNTER — Other Ambulatory Visit: Payer: Self-pay

## 2021-12-31 MED ORDER — LEVOTHYROXINE SODIUM 88 MCG PO TABS
88.0000 ug | ORAL_TABLET | Freq: Every day | ORAL | 2 refills | Status: DC
  Filled 2021-12-31: qty 90, 90d supply, fill #0
  Filled 2022-03-31: qty 90, 90d supply, fill #1
  Filled 2022-07-03: qty 90, 90d supply, fill #2

## 2022-01-01 ENCOUNTER — Other Ambulatory Visit: Payer: Self-pay

## 2022-01-01 MED ORDER — METOPROLOL SUCCINATE ER 25 MG PO TB24
ORAL_TABLET | ORAL | 12 refills | Status: DC
  Filled 2022-01-01: qty 90, 30d supply, fill #0
  Filled 2022-03-02: qty 90, 30d supply, fill #1
  Filled 2022-04-01: qty 90, 30d supply, fill #2
  Filled 2022-05-27: qty 90, 30d supply, fill #3
  Filled 2022-07-03: qty 90, 30d supply, fill #4

## 2022-01-24 DIAGNOSIS — H25013 Cortical age-related cataract, bilateral: Secondary | ICD-10-CM | POA: Diagnosis not present

## 2022-01-24 DIAGNOSIS — H2513 Age-related nuclear cataract, bilateral: Secondary | ICD-10-CM | POA: Diagnosis not present

## 2022-01-24 DIAGNOSIS — H16223 Keratoconjunctivitis sicca, not specified as Sjogren's, bilateral: Secondary | ICD-10-CM | POA: Diagnosis not present

## 2022-01-24 DIAGNOSIS — H5203 Hypermetropia, bilateral: Secondary | ICD-10-CM | POA: Diagnosis not present

## 2022-01-24 DIAGNOSIS — H524 Presbyopia: Secondary | ICD-10-CM | POA: Diagnosis not present

## 2022-01-24 DIAGNOSIS — H25813 Combined forms of age-related cataract, bilateral: Secondary | ICD-10-CM | POA: Diagnosis not present

## 2022-01-24 DIAGNOSIS — H1045 Other chronic allergic conjunctivitis: Secondary | ICD-10-CM | POA: Diagnosis not present

## 2022-01-24 DIAGNOSIS — H04123 Dry eye syndrome of bilateral lacrimal glands: Secondary | ICD-10-CM | POA: Diagnosis not present

## 2022-01-24 DIAGNOSIS — H52222 Regular astigmatism, left eye: Secondary | ICD-10-CM | POA: Diagnosis not present

## 2022-02-03 DIAGNOSIS — E7800 Pure hypercholesterolemia, unspecified: Secondary | ICD-10-CM | POA: Diagnosis not present

## 2022-02-03 DIAGNOSIS — Z79899 Other long term (current) drug therapy: Secondary | ICD-10-CM | POA: Diagnosis not present

## 2022-02-03 DIAGNOSIS — Z1331 Encounter for screening for depression: Secondary | ICD-10-CM | POA: Diagnosis not present

## 2022-02-03 DIAGNOSIS — Z299 Encounter for prophylactic measures, unspecified: Secondary | ICD-10-CM | POA: Diagnosis not present

## 2022-02-03 DIAGNOSIS — I10 Essential (primary) hypertension: Secondary | ICD-10-CM | POA: Diagnosis not present

## 2022-02-03 DIAGNOSIS — H04129 Dry eye syndrome of unspecified lacrimal gland: Secondary | ICD-10-CM | POA: Diagnosis not present

## 2022-02-03 DIAGNOSIS — Z0000 Encounter for general adult medical examination without abnormal findings: Secondary | ICD-10-CM | POA: Diagnosis not present

## 2022-02-03 DIAGNOSIS — Z6830 Body mass index (BMI) 30.0-30.9, adult: Secondary | ICD-10-CM | POA: Diagnosis not present

## 2022-02-07 ENCOUNTER — Other Ambulatory Visit: Payer: Self-pay

## 2022-02-12 DIAGNOSIS — E7800 Pure hypercholesterolemia, unspecified: Secondary | ICD-10-CM | POA: Diagnosis not present

## 2022-02-12 DIAGNOSIS — Z0000 Encounter for general adult medical examination without abnormal findings: Secondary | ICD-10-CM | POA: Diagnosis not present

## 2022-02-12 DIAGNOSIS — R5383 Other fatigue: Secondary | ICD-10-CM | POA: Diagnosis not present

## 2022-02-12 DIAGNOSIS — Z79899 Other long term (current) drug therapy: Secondary | ICD-10-CM | POA: Diagnosis not present

## 2022-03-02 ENCOUNTER — Other Ambulatory Visit: Payer: Self-pay

## 2022-03-03 ENCOUNTER — Other Ambulatory Visit: Payer: Self-pay

## 2022-03-12 ENCOUNTER — Other Ambulatory Visit: Payer: Self-pay

## 2022-03-13 ENCOUNTER — Other Ambulatory Visit: Payer: Self-pay

## 2022-03-13 MED ORDER — CLONIDINE HCL 0.1 MG PO TABS
ORAL_TABLET | ORAL | 2 refills | Status: DC
  Filled 2022-03-13: qty 60, 30d supply, fill #0

## 2022-03-14 ENCOUNTER — Other Ambulatory Visit: Payer: Self-pay

## 2022-03-14 MED ORDER — CLONIDINE HCL 0.1 MG PO TABS
ORAL_TABLET | ORAL | 2 refills | Status: DC
  Filled 2022-03-14 (×2): qty 120, 30d supply, fill #0
  Filled 2022-05-13: qty 120, 30d supply, fill #1
  Filled 2022-06-12 – 2022-06-13 (×2): qty 120, 30d supply, fill #2

## 2022-03-17 ENCOUNTER — Other Ambulatory Visit: Payer: Self-pay

## 2022-03-17 MED ORDER — LOSARTAN POTASSIUM 100 MG PO TABS
100.0000 mg | ORAL_TABLET | Freq: Every morning | ORAL | 4 refills | Status: DC
  Filled 2022-03-17: qty 90, 90d supply, fill #0
  Filled 2022-06-13: qty 90, 90d supply, fill #1

## 2022-03-17 MED ORDER — HYDROCHLOROTHIAZIDE 25 MG PO TABS
25.0000 mg | ORAL_TABLET | Freq: Every morning | ORAL | 3 refills | Status: DC
  Filled 2022-03-17: qty 90, 90d supply, fill #0
  Filled 2022-06-12 – 2022-07-03 (×2): qty 90, 90d supply, fill #1

## 2022-03-18 ENCOUNTER — Other Ambulatory Visit: Payer: Self-pay

## 2022-03-31 ENCOUNTER — Other Ambulatory Visit: Payer: Self-pay

## 2022-04-01 ENCOUNTER — Other Ambulatory Visit: Payer: Self-pay

## 2022-05-13 ENCOUNTER — Other Ambulatory Visit: Payer: Self-pay

## 2022-05-28 ENCOUNTER — Other Ambulatory Visit: Payer: Self-pay

## 2022-06-12 ENCOUNTER — Other Ambulatory Visit: Payer: Self-pay

## 2022-06-13 ENCOUNTER — Other Ambulatory Visit: Payer: Self-pay

## 2022-06-13 MED ORDER — DULOXETINE HCL 60 MG PO CPEP
ORAL_CAPSULE | ORAL | 3 refills | Status: DC
  Filled 2022-06-13: qty 90, 90d supply, fill #0
  Filled 2022-09-17: qty 90, 90d supply, fill #1
  Filled 2022-12-15: qty 90, 90d supply, fill #2
  Filled 2023-03-15: qty 90, 90d supply, fill #3

## 2022-06-17 ENCOUNTER — Other Ambulatory Visit: Payer: Self-pay

## 2022-06-17 MED ORDER — LOSARTAN POTASSIUM 100 MG PO TABS
100.0000 mg | ORAL_TABLET | Freq: Every day | ORAL | 0 refills | Status: DC
  Filled 2022-06-17: qty 90, 90d supply, fill #0

## 2022-06-17 MED ORDER — LOSARTAN POTASSIUM 100 MG PO TABS
100.0000 mg | ORAL_TABLET | Freq: Every morning | ORAL | 0 refills | Status: DC
  Filled 2022-06-17: qty 90, 90d supply, fill #0

## 2022-07-01 DIAGNOSIS — R002 Palpitations: Secondary | ICD-10-CM | POA: Diagnosis not present

## 2022-07-01 DIAGNOSIS — R000 Tachycardia, unspecified: Secondary | ICD-10-CM | POA: Diagnosis not present

## 2022-07-01 DIAGNOSIS — I10 Essential (primary) hypertension: Secondary | ICD-10-CM | POA: Diagnosis not present

## 2022-07-01 DIAGNOSIS — Z6831 Body mass index (BMI) 31.0-31.9, adult: Secondary | ICD-10-CM | POA: Diagnosis not present

## 2022-07-01 DIAGNOSIS — Z299 Encounter for prophylactic measures, unspecified: Secondary | ICD-10-CM | POA: Diagnosis not present

## 2022-07-03 ENCOUNTER — Other Ambulatory Visit: Payer: Self-pay

## 2022-07-03 ENCOUNTER — Other Ambulatory Visit: Payer: Self-pay | Admitting: Nurse Practitioner

## 2022-07-03 DIAGNOSIS — R002 Palpitations: Secondary | ICD-10-CM

## 2022-07-10 ENCOUNTER — Ambulatory Visit
Admission: RE | Admit: 2022-07-10 | Discharge: 2022-07-10 | Disposition: A | Discharge status: Home / Self Care | Payer: Commercial Managed Care - PPO | Source: Ambulatory Visit | Attending: Nurse Practitioner | Admitting: Nurse Practitioner

## 2022-07-10 DIAGNOSIS — R002 Palpitations: Secondary | ICD-10-CM | POA: Insufficient documentation

## 2022-07-10 DIAGNOSIS — I361 Nonrheumatic tricuspid (valve) insufficiency: Secondary | ICD-10-CM | POA: Diagnosis not present

## 2022-07-10 DIAGNOSIS — G43909 Migraine, unspecified, not intractable, without status migrainosus: Secondary | ICD-10-CM | POA: Diagnosis not present

## 2022-07-10 LAB — ECHOCARDIOGRAM COMPLETE
AR max vel: 2.21 cm2
AV Area VTI: 2.17 cm2
AV Area mean vel: 2.13 cm2
AV Mean grad: 4 mmHg
AV Peak grad: 6.6 mmHg
Ao pk vel: 1.28 m/s
Area-P 1/2: 4.1 cm2
S' Lateral: 2.8 cm

## 2022-07-10 NOTE — Progress Notes (Signed)
*  PRELIMINARY RESULTS* Echocardiogram 2D Echocardiogram has been performed.  Sherrie Sport 07/10/2022, 9:48 AM

## 2022-08-27 ENCOUNTER — Other Ambulatory Visit: Payer: Self-pay

## 2022-08-27 ENCOUNTER — Encounter: Payer: Self-pay | Admitting: Cardiovascular Disease

## 2022-08-27 ENCOUNTER — Ambulatory Visit: Payer: Commercial Managed Care - PPO | Attending: Cardiovascular Disease | Admitting: Cardiovascular Disease

## 2022-08-27 VITALS — BP 110/72 | HR 67 | Ht 66.5 in | Wt 182.4 lb

## 2022-08-27 DIAGNOSIS — Z8249 Family history of ischemic heart disease and other diseases of the circulatory system: Secondary | ICD-10-CM | POA: Diagnosis not present

## 2022-08-27 DIAGNOSIS — I10 Essential (primary) hypertension: Secondary | ICD-10-CM | POA: Diagnosis not present

## 2022-08-27 DIAGNOSIS — I480 Paroxysmal atrial fibrillation: Secondary | ICD-10-CM | POA: Diagnosis not present

## 2022-08-27 MED ORDER — METOPROLOL TARTRATE 50 MG PO TABS
50.0000 mg | ORAL_TABLET | Freq: Two times a day (BID) | ORAL | 3 refills | Status: DC | PRN
  Filled 2022-08-27: qty 60, 30d supply, fill #0
  Filled 2022-10-13: qty 60, 30d supply, fill #1

## 2022-08-27 NOTE — Patient Instructions (Addendum)
CT coronary calcium score, family hx, hyperlipidemia  Medication Instructions:  Metoprolol tartrate 50 mg as needed for atrial fibrillation  If you need a refill on your cardiac medications before your next appointment, please call your pharmacy.   Lab work: No new labs needed  Testing/Procedures: We will order CT coronary calcium score $99 at our [facility] in [place]  Please call [place] @ [phone] to schedule  [place], [place] [address]   Follow-Up: At [facility], you and your health needs are our priority.  As part of our continuing mission to provide you with exceptional heart care, we have created designated Provider Care Teams.  These Care Teams include your primary Cardiologist (physician) and Advanced Practice Providers (APPs -  Physician Assistants and Nurse Practitioners) who all work together to provide you with the care you need, when you need it.  You will need a follow up appointment as needed  Providers on your designated Care Team:   Murray Hodgkins, NP Christell Faith, PA-C Cadence Kathlen Mody, [place]  COVID-19 Vaccine Information can be found at: [email or web address] For questions related to vaccine distribution or appointments, please email [email or web address] or call [phone].

## 2022-08-27 NOTE — Progress Notes (Signed)
Cardiology Office Note  Date:  08/27/2022   ID:  Patricia OBERHOLTZER, DOB 09-Nov-1960, MRN [id number]  PCP:  Glenda Chroman, MD   Chief Complaint  Patient presents with   New Patient (Initial Visit)    Ref by Nicanor Bake, NP for HTN, palpations & new A-Fib. Patient found to have A-Fib on her apple watch with a HR 148 with shortness of breath. [facility]; wore a Zio monitor 3-4 years ago for palpitations. Medications reviewed by the patient verbally.      HPI:  Ms. Patricia Glover is a 62 year old woman with past medical history of Hyperlipidemia Strong family history cardiac disease Palpitations Who presents by referral from Nicanor Bake for palpitations, self-reported afib  Covid late dec 23 Reports that in mid January 2024 she woke with tachycardia, SOB, chest tightness Called daughter Apple Watch was in place, reported heart rates elevated Got ready to go to the ER at [facility] outside in cold, 2-3 min, in Car , converted, NSR, went back home Tachycardia for 1.5 hrs total, irregular, rate 110 to 148  No snoring, has sleep paralysis, may be breath holding Remote testing of sleep apnea Frequent waking  Remote 3 days monitor several years ago with no significant arrhythmia  Echo January 2024 Normal study, grade 1 diastolic dysfunction  Currently takes metoprolol succinate 50 twice daily  EKG personally reviewed by myself on todays visit Normal sinus rhythm with nonspecific T wave abnormality V1 through V5 aVF   PMH:   has a past medical history of Complication of anesthesia, Hypothyroid, and Migraines.  PSH:    Past Surgical History:  Procedure Laterality Date   CHOLECYSTECTOMY     KNEE SURGERY     PILONIDAL CYST EXCISION     35 yrs ago   TOTAL ABDOMINAL HYSTERECTOMY      Current Outpatient Medications  Medication Sig Dispense Refill   calcium carbonate (OSCAL) 1500 (600 Ca) MG TABS tablet Take 1 tablet by mouth daily.     diclofenac Sodium  (VOLTAREN) 1 % GEL Apply topically.     DULoxetine (CYMBALTA) 60 MG capsule Take 60 mg by mouth every morning.      famotidine (PEPCID) 40 MG tablet TAKE 1 TABLET BY MOUTH DAILY 30 tablet 1   hydrochlorothiazide (HYDRODIURIL) 25 MG tablet 1 Tablet am 30 tablet 0   ketorolac (TORADOL) 10 MG tablet Take 10 mg by mouth every 6 (six) hours as needed. For migraines     levothyroxine (SYNTHROID) 88 MCG tablet 1 tab by mouth daily 30 tablet 0   losartan (COZAAR) 100 MG tablet Take 1 qam Tablet by mouth daily 60 tablet 12   MAGNESIUM BISGLYCINATE PO Take by mouth.     Melatonin 5 MG TABS Take 1 tablet by mouth at bedtime.     metoprolol succinate (TOPROL-XL) 25 MG 24 hr tablet Take 25 mg by mouth in the morning and at bedtime.     metoprolol tartrate (LOPRESSOR) 50 MG tablet Take 1 tablet (50 mg total) by mouth 2 (two) times daily as needed. 60 tablet 3   Omega-3 1000 MG CAPS Take by mouth.     ondansetron (ZOFRAN) 4 MG tablet Take 4 mg by mouth every 8 (eight) hours as needed. For nausea with migraines     Red Yeast Rice Extract 600 MG CAPS Take by mouth.     scopolamine (TRANSDERM-SCOP) 1 MG/3DAYS 1 (one) Patch apply 4hr prior to traveling, place behind ear change q72 hours  prn 3 patch 0   SUMAtriptan (IMITREX) 100 MG tablet 1 (one) Tablet as directed prn migraine headache 10 tablet 5   cloNIDine (CATAPRES) 0.1 MG tablet Take 1 (one) Tablet bid--Dose changed D/C .2 mg dose (Patient not taking: Reported on 08/27/2022) 60 tablet 2   COVID-19 At Home Antigen Test (CARESTART COVID-19 HOME TEST) KIT use as directed within package instructions (Patient not taking: Reported on 08/27/2022) 2 kit 0   DULoxetine (CYMBALTA) 60 MG capsule TAKE 1 CAPSULE BY MOUTH ONCE DAILY EVERY MORNING (Patient not taking: Reported on 08/27/2022) 150 capsule 0   DULoxetine (CYMBALTA) 60 MG capsule Take 1 (one) Capsule qam (Patient not taking: Reported on 08/27/2022) 90 capsule 3   mirabegron ER (MYRBETRIQ) 25 MG TB24 tablet TAKE 1  TABLET BY MOUTH ONCE DAILY (Patient not taking: Reported on 08/27/2022) 30 tablet 5   oxyCODONE-acetaminophen (PERCOCET) 10-325 MG per tablet Take 1 tablet by mouth every 4 (four) hours as needed. For severe migraines (Patient not taking: Reported on 08/27/2022)     traMADol (ULTRAM) 50 MG tablet Take 1 tablet (50 mg total) by mouth every 12 (twelve) hours as needed. (Patient not taking: Reported on 08/27/2022) 10 tablet 0   No current facility-administered medications for this visit.     Allergies:   Meperidine hcl, Morphine, Morphine and related, and Meperidine   Social History:  The patient  reports that she has never smoked. She does not have any smokeless tobacco history on file. She reports current alcohol use. She reports that she does not use drugs.   Family History:   family history includes Asthma in her sister; Heart attack (age of onset: 44) in her maternal grandfather; Hyperlipidemia in her mother; Malignant hypertension in her mother; Stomach cancer in her maternal grandmother.    Review of Systems: Review of Systems  Constitutional: Negative.   HENT: Negative.    Respiratory: Negative.    Cardiovascular:  Positive for palpitations.       Paroxysmal tachycardia  Gastrointestinal: Negative.   Musculoskeletal: Negative.   Neurological: Negative.   Psychiatric/Behavioral: Negative.    All other systems reviewed and are negative.    PHYSICAL EXAM: VS:  BP 110/72 (BP Location: Right Arm, Patient Position: Sitting, Cuff Size: Normal)   Pulse 67   Ht 5' 6.5" (1.689 m)   Wt 182 lb 6 oz (82.7 kg)   SpO2 98%   BMI 29.00 kg/m  , BMI Body mass index is 29 kg/m. GEN: Well nourished, well developed, in no acute distress HEENT: normal Neck: no JVD, carotid bruits, or masses Cardiac: RRR; no murmurs, rubs, or gallops,no edema  Respiratory:  clear to auscultation bilaterally, normal work of breathing GI: soft, nontender, nondistended, + BS MS: no deformity or atrophy Skin:  warm and dry, no rash Neuro:  Strength and sensation are intact Psych: euthymic mood, full affect   Recent Labs: No results found for requested labs within last 365 days.    Lipid Panel No results found for: "CHOL", "HDL", "LDLCALC", "TRIG"    Wt Readings from Last 3 Encounters:  08/27/22 182 lb 6 oz (82.7 kg)  02/17/14 175 lb 9.6 oz (79.7 kg)  04/13/13 171 lb 3.2 oz (77.7 kg)     ASSESSMENT AND PLAN:  Problem List Items Addressed This Visit   None Visit Diagnoses     Paroxysmal atrial fibrillation (HCC)    -  Primary   Relevant Medications   metoprolol tartrate (LOPRESSOR) 50 MG tablet   Other  Relevant Orders   EKG 12-Lead   Benign essential HTN       Relevant Medications   metoprolol tartrate (LOPRESSOR) 50 MG tablet   Other Relevant Orders   EKG 12-Lead   Family history of heart disease       Relevant Orders   CT CARDIAC SCORING (SELF PAY ONLY)      Paroxysmal tachycardia/atrial fibrillation Reports having 1.5-hour episode of tachycardia concerning for atrial fibrillation in mid January 2024, resolved without intervention.  Woke up with it, seem to break after taking her morning medications CHADS VASC 2, given lone episode, will not start on anticoagulation at this time She will continue to use Apple Watch for home monitoring Denies sleep apnea but does have symptoms of sleep paralysis Recommend she take metoprolol to tartrate 50 mg as needed for recurrent episodes of atrial fibrillation/tachycardia Will hold off on Zio monitor given she has Apple Watch for close monitoring Recent echocardiogram with no structural heart disease For recurrent episodes, may need to consider Zio, antiarrhythmic medication, even ablation, official sleep study  Essential hypertension Blood pressure low today, no medication changes made Reports it is elevated in the evening, could consider taking clonidine 0.1 mg every 8 hours rather than twice daily  Hyperlipidemia Screening  study suggested, we have ordered CT coronary calcium scoring for further risk stratification given strong family history    Total encounter time more than 50 minutes  Greater than 50% was spent in counseling and coordination of care with the patient    Signed, Esmond Plants, M.D., Ph.D. [place], [place]

## 2022-08-29 ENCOUNTER — Other Ambulatory Visit: Payer: Self-pay

## 2022-09-01 ENCOUNTER — Ambulatory Visit
Admission: RE | Admit: 2022-09-01 | Discharge: 2022-09-01 | Disposition: A | Discharge status: Home / Self Care | Payer: Commercial Managed Care - PPO | Source: Ambulatory Visit | Attending: Cardiovascular Disease | Admitting: Cardiovascular Disease

## 2022-09-01 ENCOUNTER — Other Ambulatory Visit: Payer: Self-pay

## 2022-09-01 DIAGNOSIS — Z8249 Family history of ischemic heart disease and other diseases of the circulatory system: Secondary | ICD-10-CM | POA: Insufficient documentation

## 2022-09-01 MED ORDER — HYDROCHLOROTHIAZIDE 25 MG PO TABS
25.0000 mg | ORAL_TABLET | Freq: Every morning | ORAL | 3 refills | Status: AC
  Filled 2022-09-01: qty 90, 90d supply, fill #0

## 2022-09-01 MED ORDER — HYDROCHLOROTHIAZIDE 25 MG PO TABS
25.0000 mg | ORAL_TABLET | Freq: Every morning | ORAL | 3 refills | Status: DC
  Filled 2022-09-01 – 2022-09-17 (×2): qty 90, 90d supply, fill #0
  Filled 2022-12-30: qty 90, 90d supply, fill #1
  Filled 2023-03-15: qty 90, 90d supply, fill #2
  Filled 2023-06-21: qty 90, 90d supply, fill #3

## 2022-09-04 ENCOUNTER — Other Ambulatory Visit: Payer: Self-pay

## 2022-09-07 ENCOUNTER — Other Ambulatory Visit: Payer: Self-pay

## 2022-09-09 ENCOUNTER — Other Ambulatory Visit: Payer: Self-pay

## 2022-09-09 MED ORDER — METOPROLOL SUCCINATE ER 25 MG PO TB24
ORAL_TABLET | ORAL | 4 refills | Status: DC
  Filled 2022-09-09: qty 90, 30d supply, fill #0
  Filled 2022-10-14: qty 90, 30d supply, fill #1
  Filled 2022-11-24: qty 90, 30d supply, fill #2
  Filled 2023-01-20: qty 90, 30d supply, fill #3
  Filled 2023-02-23: qty 90, 30d supply, fill #4

## 2022-09-17 ENCOUNTER — Other Ambulatory Visit: Payer: Self-pay

## 2022-09-18 ENCOUNTER — Other Ambulatory Visit: Payer: Self-pay

## 2022-09-18 MED ORDER — LOSARTAN POTASSIUM 100 MG PO TABS
100.0000 mg | ORAL_TABLET | ORAL | 0 refills | Status: DC
  Filled 2022-09-18: qty 90, 90d supply, fill #0

## 2022-09-18 MED ORDER — SCOPOLAMINE 1 MG/3DAYS TD PT72
1.0000 | MEDICATED_PATCH | TRANSDERMAL | 0 refills | Status: AC
  Filled 2022-09-18: qty 3, 9d supply, fill #0

## 2022-09-18 MED ORDER — LEVOTHYROXINE SODIUM 88 MCG PO TABS
88.0000 ug | ORAL_TABLET | Freq: Every day | ORAL | 1 refills | Status: DC
  Filled 2022-09-18: qty 90, 90d supply, fill #0
  Filled 2022-12-30: qty 90, 90d supply, fill #1

## 2022-10-13 ENCOUNTER — Other Ambulatory Visit: Payer: Self-pay

## 2022-10-13 MED ORDER — CLONIDINE HCL 0.1 MG PO TABS
0.2000 mg | ORAL_TABLET | Freq: Two times a day (BID) | ORAL | 2 refills | Status: DC
  Filled 2023-02-23: qty 120, 30d supply, fill #0
  Filled 2023-03-31 (×2): qty 120, 30d supply, fill #1
  Filled 2023-05-17 – 2023-05-18 (×2): qty 60, 15d supply, fill #2

## 2022-10-13 MED ORDER — CLONIDINE HCL 0.1 MG PO TABS
0.2000 mg | ORAL_TABLET | Freq: Two times a day (BID) | ORAL | 2 refills | Status: DC
  Filled 2022-10-13: qty 120, 30d supply, fill #0
  Filled 2022-11-24: qty 120, 30d supply, fill #1
  Filled 2023-01-20: qty 120, 30d supply, fill #2

## 2022-10-16 ENCOUNTER — Other Ambulatory Visit: Payer: Self-pay

## 2022-10-16 DIAGNOSIS — J45909 Unspecified asthma, uncomplicated: Secondary | ICD-10-CM | POA: Diagnosis not present

## 2022-10-16 DIAGNOSIS — J069 Acute upper respiratory infection, unspecified: Secondary | ICD-10-CM | POA: Diagnosis not present

## 2022-10-16 DIAGNOSIS — R0683 Snoring: Secondary | ICD-10-CM | POA: Diagnosis not present

## 2022-10-16 DIAGNOSIS — Z299 Encounter for prophylactic measures, unspecified: Secondary | ICD-10-CM | POA: Diagnosis not present

## 2022-10-16 DIAGNOSIS — I10 Essential (primary) hypertension: Secondary | ICD-10-CM | POA: Diagnosis not present

## 2022-10-16 MED ORDER — BREZTRI AEROSPHERE 160-9-4.8 MCG/ACT IN AERO
2.0000 | INHALATION_SPRAY | Freq: Two times a day (BID) | RESPIRATORY_TRACT | 1 refills | Status: AC
  Filled 2022-10-16: qty 10.7, 30d supply, fill #0
  Filled 2023-08-24: qty 10.7, 30d supply, fill #1

## 2022-11-24 ENCOUNTER — Other Ambulatory Visit: Payer: Self-pay

## 2022-12-15 ENCOUNTER — Other Ambulatory Visit: Payer: Self-pay

## 2022-12-15 MED ORDER — LOSARTAN POTASSIUM 100 MG PO TABS
100.0000 mg | ORAL_TABLET | Freq: Every morning | ORAL | 2 refills | Status: DC
  Filled 2022-12-15: qty 90, 90d supply, fill #0
  Filled 2023-03-15: qty 90, 90d supply, fill #1
  Filled 2023-05-24: qty 90, 90d supply, fill #2

## 2022-12-26 DIAGNOSIS — G4730 Sleep apnea, unspecified: Secondary | ICD-10-CM | POA: Diagnosis not present

## 2022-12-30 ENCOUNTER — Other Ambulatory Visit: Payer: Self-pay

## 2023-01-02 DIAGNOSIS — G4733 Obstructive sleep apnea (adult) (pediatric): Secondary | ICD-10-CM | POA: Diagnosis not present

## 2023-01-06 ENCOUNTER — Encounter: Payer: Self-pay | Admitting: Cardiovascular Disease

## 2023-01-20 ENCOUNTER — Other Ambulatory Visit: Payer: Self-pay

## 2023-01-28 DIAGNOSIS — G4733 Obstructive sleep apnea (adult) (pediatric): Secondary | ICD-10-CM | POA: Diagnosis not present

## 2023-01-29 ENCOUNTER — Other Ambulatory Visit: Payer: Self-pay

## 2023-01-29 DIAGNOSIS — K5792 Diverticulitis of intestine, part unspecified, without perforation or abscess without bleeding: Secondary | ICD-10-CM | POA: Diagnosis not present

## 2023-01-29 DIAGNOSIS — I10 Essential (primary) hypertension: Secondary | ICD-10-CM | POA: Diagnosis not present

## 2023-01-29 MED ORDER — ONDANSETRON HCL 4 MG PO TABS
4.0000 mg | ORAL_TABLET | Freq: Four times a day (QID) | ORAL | 1 refills | Status: AC | PRN
  Filled 2023-01-29: qty 40, 10d supply, fill #0

## 2023-01-29 MED ORDER — METRONIDAZOLE 500 MG PO TABS
500.0000 mg | ORAL_TABLET | Freq: Three times a day (TID) | ORAL | 0 refills | Status: DC
  Filled 2023-01-29: qty 30, 10d supply, fill #0

## 2023-01-29 MED ORDER — CIPROFLOXACIN HCL 500 MG PO TABS
500.0000 mg | ORAL_TABLET | Freq: Two times a day (BID) | ORAL | 0 refills | Status: DC
  Filled 2023-01-29: qty 20, 10d supply, fill #0

## 2023-02-02 DIAGNOSIS — G4733 Obstructive sleep apnea (adult) (pediatric): Secondary | ICD-10-CM | POA: Diagnosis not present

## 2023-02-23 ENCOUNTER — Other Ambulatory Visit: Payer: Self-pay

## 2023-03-05 DIAGNOSIS — G4733 Obstructive sleep apnea (adult) (pediatric): Secondary | ICD-10-CM | POA: Diagnosis not present

## 2023-03-25 DIAGNOSIS — Z299 Encounter for prophylactic measures, unspecified: Secondary | ICD-10-CM | POA: Diagnosis not present

## 2023-03-25 DIAGNOSIS — E039 Hypothyroidism, unspecified: Secondary | ICD-10-CM | POA: Diagnosis not present

## 2023-03-25 DIAGNOSIS — R197 Diarrhea, unspecified: Secondary | ICD-10-CM | POA: Diagnosis not present

## 2023-03-25 DIAGNOSIS — R0683 Snoring: Secondary | ICD-10-CM | POA: Diagnosis not present

## 2023-03-25 DIAGNOSIS — I10 Essential (primary) hypertension: Secondary | ICD-10-CM | POA: Diagnosis not present

## 2023-03-31 ENCOUNTER — Other Ambulatory Visit: Payer: Self-pay

## 2023-03-31 ENCOUNTER — Encounter: Payer: Self-pay | Admitting: *Deleted

## 2023-03-31 MED ORDER — METOPROLOL SUCCINATE ER 25 MG PO TB24
ORAL_TABLET | ORAL | 0 refills | Status: DC
  Filled 2023-03-31: qty 90, 30d supply, fill #0

## 2023-03-31 MED ORDER — XIFAXAN 200 MG PO TABS
200.0000 mg | ORAL_TABLET | Freq: Three times a day (TID) | ORAL | 0 refills | Status: DC | PRN
  Filled 2023-03-31: qty 90, 30d supply, fill #0

## 2023-03-31 MED ORDER — THYROID 60 MG PO TABS
60.0000 mg | ORAL_TABLET | Freq: Every day | ORAL | 2 refills | Status: DC
  Filled 2023-03-31: qty 90, 90d supply, fill #0
  Filled 2023-06-08: qty 90, 90d supply, fill #1
  Filled 2023-09-07: qty 90, 90d supply, fill #2

## 2023-03-31 MED ORDER — CLONAZEPAM 0.5 MG PO TABS
0.5000 mg | ORAL_TABLET | Freq: Three times a day (TID) | ORAL | 0 refills | Status: DC | PRN
  Filled 2023-03-31: qty 21, 7d supply, fill #0

## 2023-04-04 DIAGNOSIS — G4733 Obstructive sleep apnea (adult) (pediatric): Secondary | ICD-10-CM | POA: Diagnosis not present

## 2023-04-08 ENCOUNTER — Other Ambulatory Visit: Payer: Self-pay

## 2023-04-10 ENCOUNTER — Other Ambulatory Visit: Payer: Self-pay

## 2023-04-14 DIAGNOSIS — G4733 Obstructive sleep apnea (adult) (pediatric): Secondary | ICD-10-CM | POA: Diagnosis not present

## 2023-04-23 IMAGING — MG MM DIGITAL SCREENING BILAT W/ TOMO AND CAD
8 series · 8 of 24 positions shown · non-contrast
Comparison: Previous exam(s).

CLINICAL DATA: Screening.

EXAM:
DIGITAL SCREENING BILATERAL MAMMOGRAM WITH TOMOSYNTHESIS AND CAD
TECHNIQUE: Bilateral screening digital craniocaudal and mediolateral oblique
mammograms were obtained. Bilateral screening digital breast
tomosynthesis was performed. The images were evaluated with
computer-aided detection.

[R CC synth-2D]
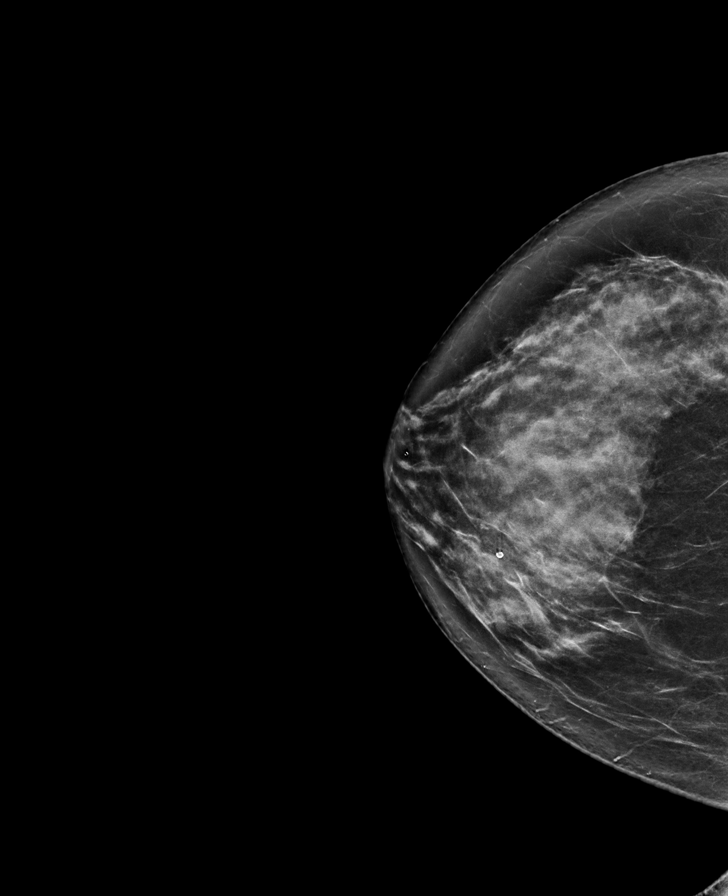

[L MLO synth-2D]
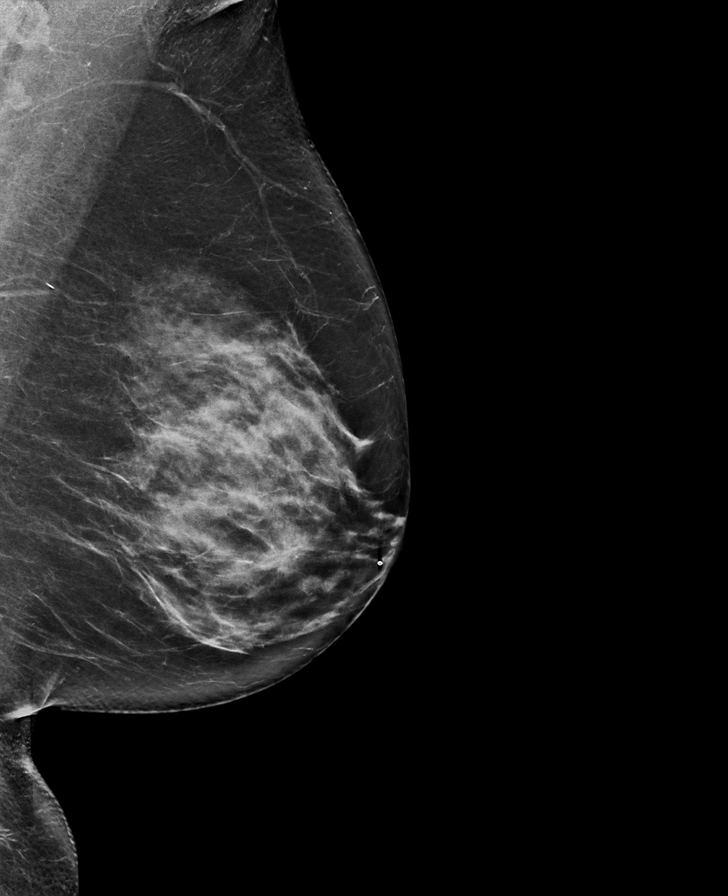

[R MLO synth-2D]
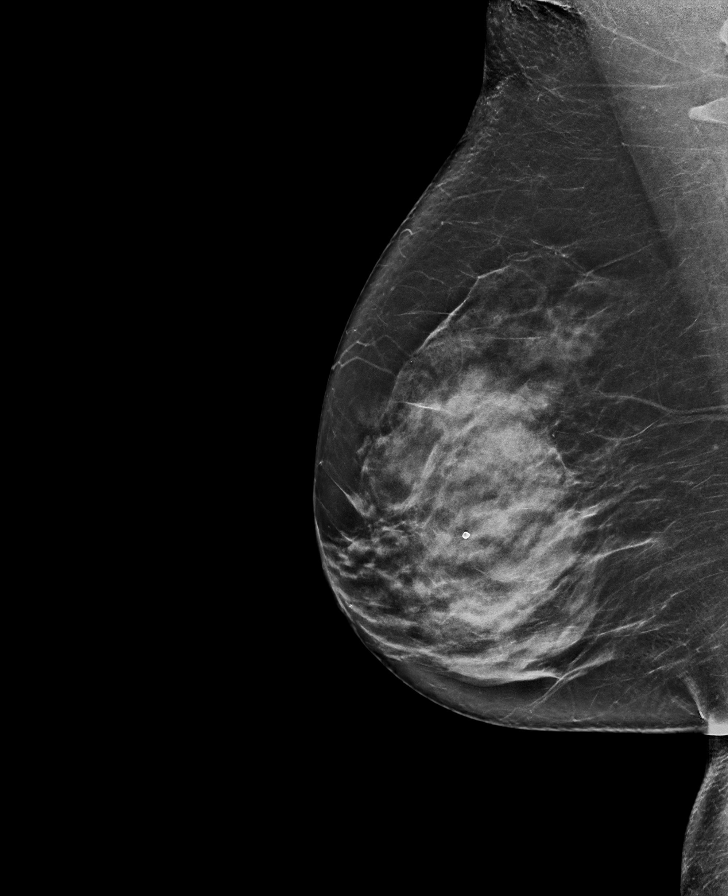

[L CC synth-2D]
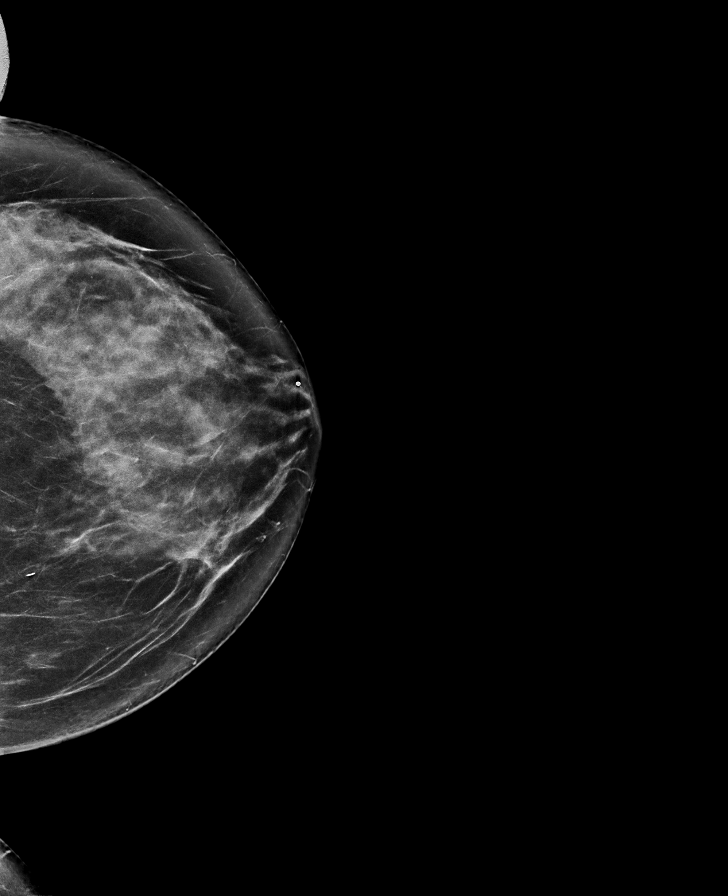

[L MLO tomo · tomo slice 43/85.0]
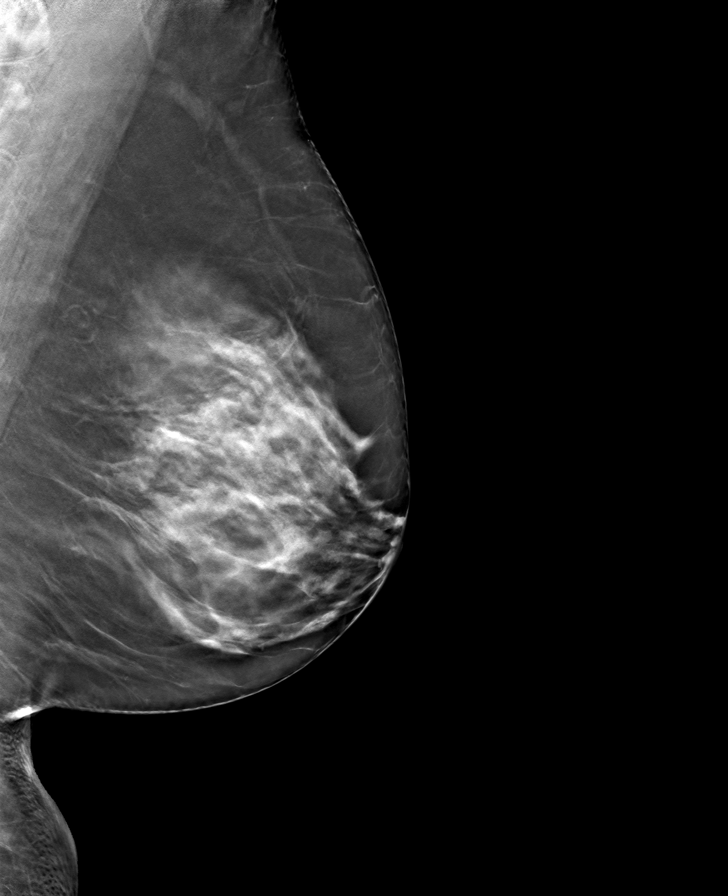

[R CC tomo · tomo slice 41/80.0]
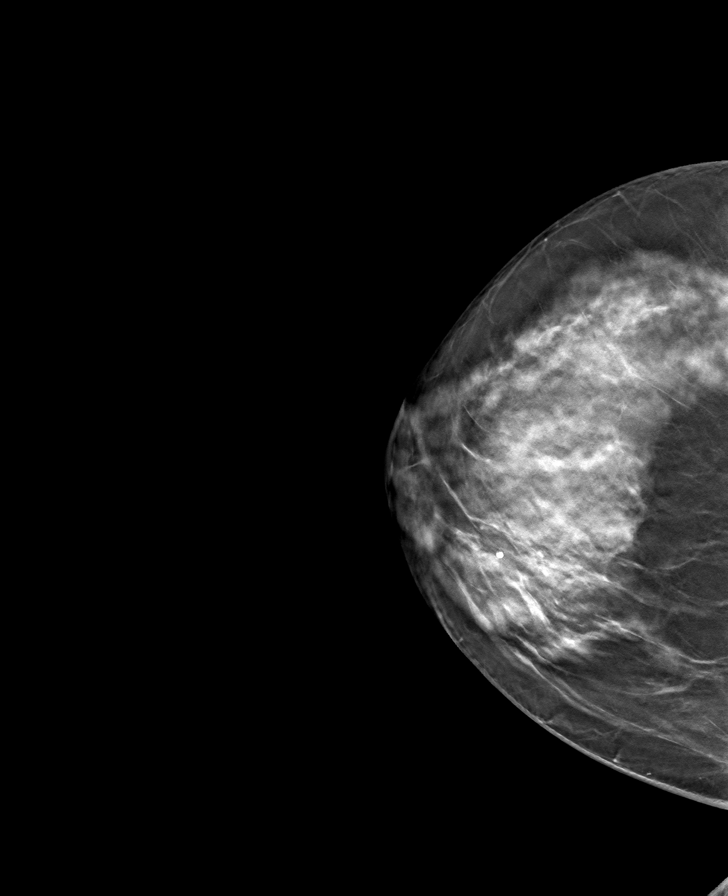

[R MLO tomo · tomo slice 41/82.0]
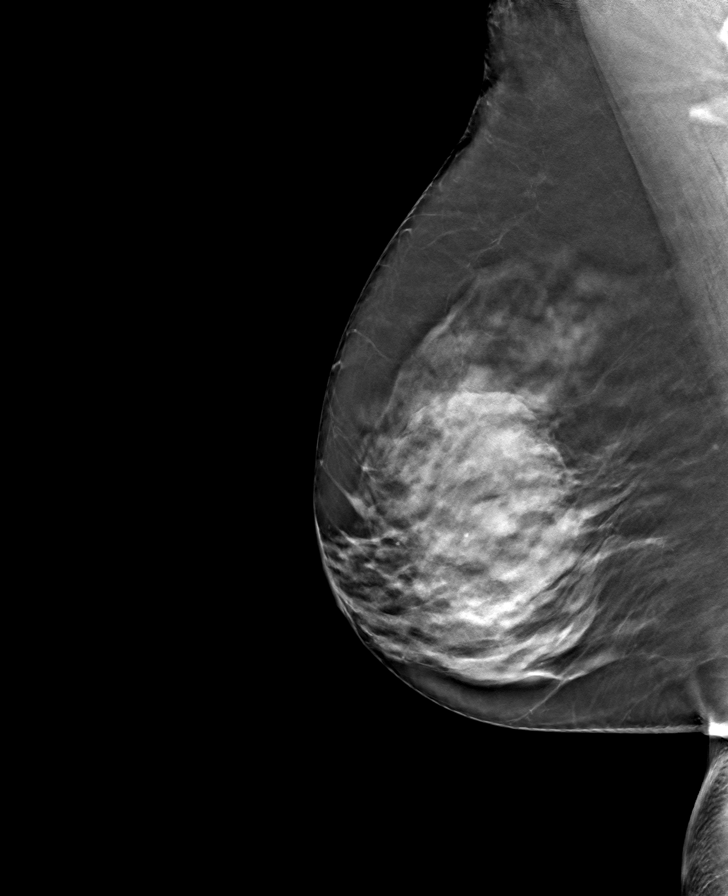

[L CC tomo · tomo slice 39/77.0]
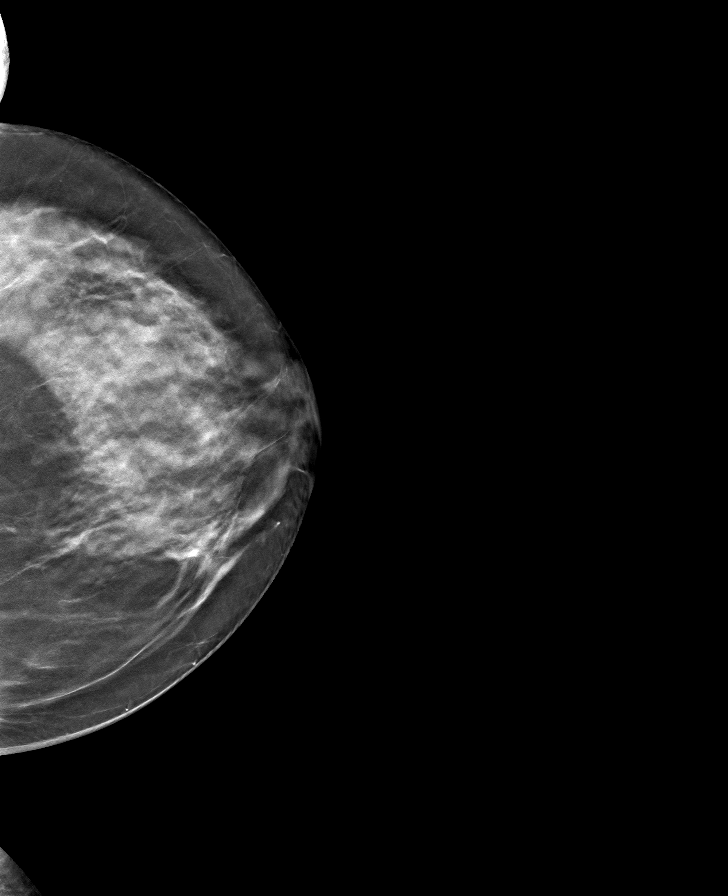

[8 of 24 positions shown; findings below may reference images not displayed]

ACR Breast Density Category d: The breast tissue is extremely dense,
which lowers the sensitivity of mammography
FINDINGS: There are no findings suspicious for malignancy. The images were
evaluated with computer-aided detection.
IMPRESSION: No mammographic evidence of malignancy. A result letter of this
screening mammogram will be mailed directly to the patient.

RECOMMENDATION:
Screening mammogram in one year. (Code:95-0-E9V)

BI-RADS CATEGORY  1: Negative.

## 2023-04-29 ENCOUNTER — Other Ambulatory Visit: Payer: Self-pay

## 2023-05-01 DIAGNOSIS — Z299 Encounter for prophylactic measures, unspecified: Secondary | ICD-10-CM | POA: Diagnosis not present

## 2023-05-01 DIAGNOSIS — E039 Hypothyroidism, unspecified: Secondary | ICD-10-CM | POA: Diagnosis not present

## 2023-05-01 DIAGNOSIS — Z1331 Encounter for screening for depression: Secondary | ICD-10-CM | POA: Diagnosis not present

## 2023-05-01 DIAGNOSIS — I10 Essential (primary) hypertension: Secondary | ICD-10-CM | POA: Diagnosis not present

## 2023-05-01 DIAGNOSIS — E7800 Pure hypercholesterolemia, unspecified: Secondary | ICD-10-CM | POA: Diagnosis not present

## 2023-05-01 DIAGNOSIS — Z0000 Encounter for general adult medical examination without abnormal findings: Secondary | ICD-10-CM | POA: Diagnosis not present

## 2023-05-01 DIAGNOSIS — Z79899 Other long term (current) drug therapy: Secondary | ICD-10-CM | POA: Diagnosis not present

## 2023-05-05 DIAGNOSIS — G4733 Obstructive sleep apnea (adult) (pediatric): Secondary | ICD-10-CM | POA: Diagnosis not present

## 2023-05-08 ENCOUNTER — Other Ambulatory Visit: Payer: Self-pay

## 2023-05-08 MED ORDER — ROSUVASTATIN CALCIUM 5 MG PO TABS
5.0000 mg | ORAL_TABLET | Freq: Every day | ORAL | 1 refills | Status: DC
  Filled 2023-05-08: qty 90, 90d supply, fill #0
  Filled 2023-07-23 – 2023-08-17 (×2): qty 90, 90d supply, fill #1

## 2023-05-15 ENCOUNTER — Other Ambulatory Visit: Payer: Self-pay

## 2023-05-15 DIAGNOSIS — G4733 Obstructive sleep apnea (adult) (pediatric): Secondary | ICD-10-CM | POA: Diagnosis not present

## 2023-05-17 ENCOUNTER — Other Ambulatory Visit: Payer: Self-pay

## 2023-05-18 ENCOUNTER — Other Ambulatory Visit: Payer: Self-pay

## 2023-05-19 ENCOUNTER — Other Ambulatory Visit: Payer: Self-pay

## 2023-05-21 DIAGNOSIS — R0981 Nasal congestion: Secondary | ICD-10-CM | POA: Diagnosis not present

## 2023-05-21 DIAGNOSIS — J45909 Unspecified asthma, uncomplicated: Secondary | ICD-10-CM | POA: Diagnosis not present

## 2023-05-21 DIAGNOSIS — J329 Chronic sinusitis, unspecified: Secondary | ICD-10-CM | POA: Diagnosis not present

## 2023-05-24 ENCOUNTER — Other Ambulatory Visit: Payer: Self-pay

## 2023-05-25 ENCOUNTER — Other Ambulatory Visit: Payer: Self-pay

## 2023-05-25 MED ORDER — METOPROLOL SUCCINATE ER 25 MG PO TB24
ORAL_TABLET | ORAL | 0 refills | Status: DC
  Filled 2023-05-25: qty 90, 30d supply, fill #0

## 2023-05-25 MED ORDER — DULOXETINE HCL 60 MG PO CPEP
60.0000 mg | ORAL_CAPSULE | Freq: Every morning | ORAL | 3 refills | Status: DC
  Filled 2023-05-25: qty 90, 90d supply, fill #0
  Filled 2023-08-24: qty 90, 90d supply, fill #1
  Filled 2023-11-18: qty 90, 90d supply, fill #2
  Filled 2024-03-08: qty 90, 90d supply, fill #3

## 2023-05-26 ENCOUNTER — Other Ambulatory Visit: Payer: Self-pay

## 2023-05-26 MED ORDER — METOPROLOL SUCCINATE ER 25 MG PO TB24
ORAL_TABLET | ORAL | 3 refills | Status: DC
  Filled 2023-05-26 – 2023-07-12 (×2): qty 270, 90d supply, fill #0

## 2023-06-04 DIAGNOSIS — G4733 Obstructive sleep apnea (adult) (pediatric): Secondary | ICD-10-CM | POA: Diagnosis not present

## 2023-06-08 ENCOUNTER — Other Ambulatory Visit: Payer: Self-pay

## 2023-06-11 ENCOUNTER — Other Ambulatory Visit: Payer: Self-pay

## 2023-06-11 MED ORDER — CLONIDINE HCL 0.1 MG PO TABS
0.2000 mg | ORAL_TABLET | Freq: Two times a day (BID) | ORAL | 2 refills | Status: DC
  Filled 2023-06-11: qty 120, 30d supply, fill #0
  Filled 2023-07-11: qty 120, 30d supply, fill #1

## 2023-06-12 ENCOUNTER — Other Ambulatory Visit: Payer: Self-pay

## 2023-06-14 DIAGNOSIS — G4733 Obstructive sleep apnea (adult) (pediatric): Secondary | ICD-10-CM | POA: Diagnosis not present

## 2023-07-05 DIAGNOSIS — G4733 Obstructive sleep apnea (adult) (pediatric): Secondary | ICD-10-CM | POA: Diagnosis not present

## 2023-07-08 ENCOUNTER — Other Ambulatory Visit: Payer: Self-pay

## 2023-07-08 DIAGNOSIS — K589 Irritable bowel syndrome without diarrhea: Secondary | ICD-10-CM | POA: Diagnosis not present

## 2023-07-08 DIAGNOSIS — K5792 Diverticulitis of intestine, part unspecified, without perforation or abscess without bleeding: Secondary | ICD-10-CM | POA: Diagnosis not present

## 2023-07-08 DIAGNOSIS — M62838 Other muscle spasm: Secondary | ICD-10-CM | POA: Diagnosis not present

## 2023-07-08 MED ORDER — CIPROFLOXACIN HCL 500 MG PO TABS
500.0000 mg | ORAL_TABLET | Freq: Two times a day (BID) | ORAL | 0 refills | Status: DC
  Filled 2023-07-08: qty 20, 10d supply, fill #0

## 2023-07-08 MED ORDER — DICYCLOMINE HCL 10 MG PO CAPS
10.0000 mg | ORAL_CAPSULE | Freq: Four times a day (QID) | ORAL | 0 refills | Status: DC
  Filled 2023-07-08: qty 40, 10d supply, fill #0

## 2023-07-11 ENCOUNTER — Other Ambulatory Visit: Payer: Self-pay

## 2023-07-12 ENCOUNTER — Other Ambulatory Visit: Payer: Self-pay

## 2023-07-13 ENCOUNTER — Ambulatory Visit: Payer: Commercial Managed Care - PPO | Admitting: Gastroenterology

## 2023-07-13 ENCOUNTER — Encounter: Payer: Self-pay | Admitting: Gastroenterology

## 2023-07-13 ENCOUNTER — Other Ambulatory Visit: Payer: Self-pay

## 2023-07-13 ENCOUNTER — Telehealth: Payer: Self-pay | Admitting: Gastroenterology

## 2023-07-13 VITALS — BP 110/72 | HR 62 | Ht 66.0 in | Wt 185.2 lb

## 2023-07-13 DIAGNOSIS — K219 Gastro-esophageal reflux disease without esophagitis: Secondary | ICD-10-CM | POA: Diagnosis not present

## 2023-07-13 DIAGNOSIS — K529 Noninfective gastroenteritis and colitis, unspecified: Secondary | ICD-10-CM | POA: Diagnosis not present

## 2023-07-13 DIAGNOSIS — Z1211 Encounter for screening for malignant neoplasm of colon: Secondary | ICD-10-CM

## 2023-07-13 DIAGNOSIS — R1310 Dysphagia, unspecified: Secondary | ICD-10-CM

## 2023-07-13 DIAGNOSIS — R1031 Right lower quadrant pain: Secondary | ICD-10-CM | POA: Diagnosis not present

## 2023-07-13 MED ORDER — METOPROLOL SUCCINATE ER 25 MG PO TB24
ORAL_TABLET | ORAL | 1 refills | Status: DC
  Filled 2023-07-13: qty 270, 90d supply, fill #0

## 2023-07-13 MED ORDER — METOPROLOL SUCCINATE ER 25 MG PO TB24
75.0000 mg | ORAL_TABLET | Freq: Every day | ORAL | 3 refills | Status: DC
  Filled 2023-07-13: qty 270, 90d supply, fill #0

## 2023-07-13 MED ORDER — OMEPRAZOLE 40 MG PO CPDR
40.0000 mg | DELAYED_RELEASE_CAPSULE | Freq: Every day | ORAL | 3 refills | Status: DC
  Filled 2023-07-13 – 2023-07-28 (×2): qty 90, 90d supply, fill #0
  Filled 2023-10-13: qty 90, 90d supply, fill #1
  Filled 2024-01-10: qty 90, 90d supply, fill #2
  Filled 2024-04-19: qty 90, 90d supply, fill #3

## 2023-07-13 MED ORDER — METOPROLOL SUCCINATE ER 25 MG PO TB24
ORAL_TABLET | ORAL | 3 refills | Status: DC
  Filled 2023-07-13: qty 270, 180d supply, fill #0

## 2023-07-13 MED ORDER — METOPROLOL SUCCINATE ER 25 MG PO TB24: 75.0000 mg | ORAL_TABLET | Freq: Every day | ORAL | 3 refills | Status: DC

## 2023-07-13 NOTE — Telephone Encounter (Signed)
After further investigation into patient's history she does use a CPAP for OSA, given this we should probably make her an ASA 3 instead of ASA 2.   Patricia Bonito, MSN, APRN, FNP-BC, AGACNP-BC [facility]

## 2023-07-13 NOTE — Telephone Encounter (Signed)
Thanks

## 2023-07-13 NOTE — Patient Instructions (Signed)
We will get your scheduled for EGD and colonoscopy in the near future with Dr. Levon Hedger.  Even though you are due for a screening purposes given your abdominal pain and your chronic diarrhea it is likely that this could be coded as diagnostic.    Continue finishing your Cipro course given you are having some improvement of your abdominal pain.  Continue taking dicyclomine 10 mg, you can increase to 3 times a day with meals.  Given your abdominal pain and your surgical history we will obtain a CT of your abdomen and pelvis.  I have sent in omeprazole for you to take 40 mg once daily, 30 minutes prior to dinner for your acid reflux.  When it comes to swallowing please remember to take small bites and alternate with sips of liquids if you are having trouble swallowing.  Eating more frequent smaller meals throughout the day may be more beneficial than eating 3 large meals.  I am attaching a handout regarding a low FODMAP diet as this is a common recommended diet for IBS with diarrhea.  If you have severe diarrhea you may take Imodium as needed.  We will plan to follow-up after your colonoscopy, if symptoms worsen prior to your procedures please do not hesitate to call the office.  It was a pleasure to see you today. I want to create trusting relationships with patients. If you receive a survey regarding your visit,  I greatly appreciate you taking time to fill this out on paper or through your MyChart. I value your feedback.  Brooke Bonito, MSN, FNP-BC, AGACNP-BC [facility]

## 2023-07-13 NOTE — Progress Notes (Signed)
GI Office Note    Referring Provider: Ignatius Specking, MD Primary Care Physician:  Ignatius Specking, MD  Primary Gastroenterologist: Dolores Frame, MD (previously Dr. Karilyn Cota in 2013)  Chief Complaint   Chief Complaint  Patient presents with   New Patient (Initial Visit)    Pt here for diverticulitis and or IBS. Pt also states time for colonoscopy   History of Present Illness   Patricia Glover is a 63 y.o. female presenting today at the request of Vyas, Dhruv B, MD for possible diverticulitis vs IBS.   Last seen in 2013 for abdominal pain and rectal bleeding. Reported history of esophageal or stomach cancer in her grandmother. Had recent Ct that rule out diverticulitis. She was scheduled for EGD and colonoscopy with Dr. Karilyn Cota.   EGD July 2013: -Normal esophagus -Normal examined stomach -Normal examined duodenum  Colonoscopy July 2013: -Normal TI, normal colonic mucosa without diverticulosis -Normal rectum -Small external hemorrhoids -Given dicyclomine 10 mg 3 times daily before meals and advised probiotic daily and to have a small bowel follow-through  Received referral to schedule colonoscopy for screening in October - questionnaire mailed.   Per referral paperwork from 1/23 - OV 1/22 - Patient reported abdominal pain for 5 days.  Noted concerns for IBS and problem last.  She was advised on diet, stress management, importance of hydration.  Patient reportedly had Flagyl at home, did not take during her last flare stating it is rough on her.  She reported she would take the Flagyl as the Cipro did not take care of her issue.  Labs from November with mildly elevated glucose of 108, normal LFTs, creatinine, and potassium.  TSH within normal limits.  Lipid panel with elevated LDL and mildly elevated total cholesterol at 244, hemoglobin stable at 12.6.  She was given dicyclomine to take 10 mg every 6 hours as needed for abdominal pain.  Today:  In 20-30s she saw Dr. Karilyn Cota  for IBS. After hysterectomy at age 41 she had done well from abdominal pain standpoint. Also had ovaries removed.   Has been having some bout of diarrhea for the last few years. Has had a couple instances of RLQ abdominal pain. On average she is a having 3-4 BM daily and sometimes 1 per day. Stools generally bristol 6 with some looser substantia pieces. Not able to have coffee or roughage. Has had some issues with nuts recently. Sometimes no rhyme or reason. Had cholecystectomy in 1994. Pain does feel similar to her endometriosis pain. Currently having pain in her RLQ 1-2/10. At its peak of pain she was unable to sleep and was worse at night. Pain would wake her up 4 hours after bedtime. No nocturnal stools. Bentyl is twice daily. She did a clear liquid diet for a few days then a BRAT dit. Within the last day or 2 she is advancing her diet. In the past sometimes this has helped her.   Stopped tacking imodium every other day right after the holidays - to keep from having accidents. Has to take it when she travels. No less abdominal pain with the imodium.   Has had minimal blood and no melena. Only toilet tissue  - maybe 2-3 times over the  last 2 months that occurs after repeated loose B. Has itching associated as well.   Always has RLQ pain - sharp stabbing pain that lasts hours usually. Before seeing PCP she was taking ibuprofren and tylenol. PCP gave her cipro and bentyl. Bentyl  does seem to be helping - shoed no improvement in her pain until trying the cipro. Is about half way through cipro prescription.   Has a history of Cdiff - happened about 10 years ago - potentially exposure from a patient. Has been careful about antibiotics since then   Mom had diverticulitis and gallbladder issues. Sister recently diagnosed with ovarian cancer.   Does admit to some reflux symptoms. Has been taking omeprazole 20 mg before bed. She does sleep with head elevated and dose have sleep apnea and does wear a CPAP.   She feels like in the past she was on sometimes else. No esophageal or chest pain. Can feel some sour taste as well after eating. Also has asthma that seems to be flaring. Has occasional dysphagia. About 1.5 hours ago she feels like her esophagus is full. Denis N/V.  Denise tums use or pepto.   May take chewable pepcid rarely. No weight loss or weight gain.   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 Has had a lot of stress as well in her life.   Has a big trip planned in April that she wants to try to get this done first.   Had an isolate event of Afib with COVID but has been 1 year. Denies heart palpitation or shortness of breath. When she tries to make dietary changes it makes her stomach worse. Tries to incorporate mediterranean into her diet but not herd core. Has had some hoarseness for a while and has seen ENT and feels like it is worse. No odynophagia.   Due for dexa scan soon.   Wt Readings from Last 3 Encounters:  07/13/23 185 lb 3.2 oz (84 kg)  08/27/22 182 lb 6 oz (82.7 kg)  02/17/14 175 lb 9.6 oz (79.7 kg)    Current Outpatient Medications  Medication Sig Dispense Refill   Budeson-Glycopyrrol-Formoterol (BREZTRI AEROSPHERE) 160-9-4.8 MCG/ACT AERO Inhale 2 puffs by mouth twice daily. 10.7 g 1   calcium carbonate (OSCAL) 1500 (600 Ca) MG TABS tablet Take 1 tablet by mouth daily.     ciprofloxacin (CIPRO) 500 MG tablet Take 1 tablet (500 mg total) by mouth 2 (two) times daily. 20 tablet 0   cloNIDine (CATAPRES) 0.1 MG tablet Take 2 tablets (0.2 mg total) by mouth 2 (two) times daily. 120 tablet 2   cloNIDine (CATAPRES) 0.1 MG tablet Take 2 tablets (0.2 mg total) by mouth 2 (two) times daily. 120 tablet 2   dicyclomine (BENTYL) 10 MG capsule Take 1 capsule (10 mg total) by mouth every 6 (six) hours. 40 capsule 0   DULoxetine (CYMBALTA) 60 MG capsule Take 60 mg by mouth every morning.      DULoxetine (CYMBALTA) 60 MG capsule Take 1 capsule (60 mg total) by mouth in the  morning. 90 capsule 3   hydrochlorothiazide (HYDRODIURIL) 25 MG tablet Take 1 tablet (25 mg total) by mouth every morning. 90 tablet 3   losartan (COZAAR) 100 MG tablet Take 1 qam Tablet by mouth daily 60 tablet 12   MAGNESIUM BISGLYCINATE PO Take by mouth.     metoprolol succinate (TOPROL XL) 25 MG 24 hr tablet Take 2 tablets (50 mg total) by mouth every morning AND 1 tablet (25 mg total) every evening. 270 tablet 3   metoprolol succinate (TOPROL XL) 25 MG 24 hr tablet Take 2 tablets by mouth in the morning and 1 tablet in the evening 270 tablet 3   metoprolol succinate (TOPROL-XL) 25 MG 24  hr tablet Take 25 mg by mouth in the morning and at bedtime.     metoprolol tartrate (LOPRESSOR) 50 MG tablet Take 1 tablet (50 mg total) by mouth 2 (two) times daily as needed. 60 tablet 3   Omega-3 1000 MG CAPS Take by mouth.     ondansetron (ZOFRAN) 4 MG tablet Take 4 mg by mouth every 8 (eight) hours as needed. For nausea with migraines     ondansetron (ZOFRAN) 4 MG tablet Take 1 tablet (4 mg total) by mouth 4 (four) times daily as needed. 40 tablet 1   rosuvastatin (CRESTOR) 5 MG tablet Take 1 tablet by mouth once daily. 90 tablet 1   scopolamine (TRANSDERM-SCOP) 1 MG/3DAYS Place 1 patch (1.5 mg total) behind ear every 3 (three) days as needed. Apply 4 hours prior to traveling 3 patch 0   SUMAtriptan (IMITREX) 100 MG tablet 1 (one) Tablet as directed prn migraine headache 10 tablet 5   thyroid (ARMOUR THYROID) 60 MG tablet Take 1 tablet (60 mg total) by mouth daily. 90 tablet 2   ciprofloxacin (CIPRO) 500 MG tablet Take 1 tablet (500 mg total) by mouth 2 (two) times daily. 20 tablet 0   clonazePAM (KLONOPIN) 0.5 MG tablet Take 1 tablet (0.5 mg total) by mouth 3 (three) times daily as needed. (Patient not taking: Reported on 07/13/2023) 21 tablet 0   COVID-19 At Home Antigen Test [facility] COVID-19 HOME TEST) KIT use as directed within package instructions (Patient not taking: Reported on 07/13/2023) 2 kit 0    diclofenac Sodium (VOLTAREN) 1 % GEL Apply topically. (Patient not taking: Reported on 07/13/2023)     DULoxetine (CYMBALTA) 60 MG capsule TAKE 1 CAPSULE BY MOUTH ONCE DAILY EVERY MORNING (Patient not taking: Reported on 08/27/2022) 150 capsule 0   famotidine (PEPCID) 40 MG tablet TAKE 1 TABLET BY MOUTH DAILY 30 tablet 1   hydrochlorothiazide (HYDRODIURIL) 25 MG tablet 1 Tablet am (Patient not taking: Reported on 07/13/2023) 30 tablet 0   hydrochlorothiazide (HYDRODIURIL) 25 MG tablet Take 1 tablet (25 mg total) by mouth every morning. (Patient not taking: Reported on 07/13/2023) 90 tablet 3   ketorolac (TORADOL) 10 MG tablet Take 10 mg by mouth every 6 (six) hours as needed. For migraines (Patient not taking: Reported on 07/13/2023)     levothyroxine (SYNTHROID) 88 MCG tablet Take 1 tablet (88 mcg total) by mouth daily. (Patient not taking: Reported on 07/13/2023) 90 tablet 1   losartan (COZAAR) 100 MG tablet Take 1 tablet (100 mg total) by mouth in the morning. (Patient not taking: Reported on 07/13/2023) 90 tablet 2   Melatonin 5 MG TABS Take 1 tablet by mouth at bedtime. (Patient not taking: Reported on 07/13/2023)     metoprolol succinate (TOPROL XL) 25 MG 24 hr tablet Take 2 tablets (50 mg total) by mouth every morning AND 1 tablet (25 mg total) every evening. (Patient not taking: Reported on 07/13/2023) 90 tablet 0   metoprolol succinate (TOPROL XL) 25 MG 24 hr tablet Take 2 tablets (50 mg total) by mouth in the morning and 1 tablet (25 mg total) every evening. (Patient not taking: Reported on 07/13/2023) 270 tablet 3   metoprolol succinate (TOPROL XL) 25 MG 24 hr tablet Take 2 tablets by mouth in the morning and 1 tablet by mouth in the evening (Patient not taking: Reported on 07/13/2023) 270 tablet 3   metoprolol succinate (TOPROL-XL) 25 MG 24 hr tablet Take 2 tablets (50 mg total) by mouth every  morning AND 1 tablet (25 mg total) every evening. (Patient not taking: Reported on 07/13/2023) 270 tablet 1    metroNIDAZOLE (FLAGYL) 500 MG tablet Take 1 tablet (500 mg total) by mouth 3 (three) times daily. (Patient not taking: Reported on 07/13/2023) 30 tablet 0   mirabegron ER (MYRBETRIQ) 25 MG TB24 tablet TAKE 1 TABLET BY MOUTH ONCE DAILY (Patient not taking: Reported on 08/27/2022) 30 tablet 5   oxyCODONE-acetaminophen (PERCOCET) 10-325 MG per tablet Take 1 tablet by mouth every 4 (four) hours as needed. For severe migraines (Patient not taking: Reported on 08/27/2022)     Red Yeast Rice Extract 600 MG CAPS Take by mouth.     rifaximin (XIFAXAN) 200 MG tablet Take 1 tablet (200 mg total) by mouth 3 (three) times daily as needed. (Patient not taking: Reported on 07/13/2023) 90 tablet 0   scopolamine (TRANSDERM-SCOP) 1 MG/3DAYS 1 (one) Patch apply 4hr prior to traveling, place behind ear change q72 hours prn (Patient not taking: Reported on 07/13/2023) 3 patch 0   traMADol (ULTRAM) 50 MG tablet Take 1 tablet (50 mg total) by mouth every 12 (twelve) hours as needed. (Patient not taking: Reported on 07/13/2023) 10 tablet 0   No current facility-administered medications for this visit.    Past Medical History:  Diagnosis Date   Complication of anesthesia    nausea, vomiting   Hypothyroid    Migraines     Past Surgical History:  Procedure Laterality Date   CHOLECYSTECTOMY     KNEE SURGERY     PILONIDAL CYST EXCISION     34 yrs ago   TOTAL ABDOMINAL HYSTERECTOMY      Family History  Problem Relation Age of Onset   Hyperlipidemia Mother    Malignant hypertension Mother    Asthma Sister    Stomach cancer Maternal Grandmother    Heart attack Maternal Grandfather 25   Breast cancer Neg Hx     Allergies as of 07/13/2023 - Review Complete 07/13/2023  Allergen Reaction Noted   Meperidine hcl Nausea And Vomiting 08/27/2022   Morphine Nausea And Vomiting 12/23/2011   Morphine and codeine Nausea And Vomiting 12/23/2011   Meperidine Nausea And Vomiting 12/23/2011    Social History    Socioeconomic History   Marital status: Married    Spouse name: Not on file   Number of children: Not on file   Years of education: Not on file   Highest education level: Not on file  Occupational History   Not on file  Tobacco Use   Smoking status: Never   Smokeless tobacco: Not on file  Vaping Use   Vaping status: Never Used  Substance and Sexual Activity   Alcohol use: Yes    Comment: occassionally,   red wine   Drug use: No   Sexual activity: Yes  Other Topics Concern   Not on file  Social History Narrative   Not on file   Social Drivers of Health   Financial Resource Strain: Not on file  Food Insecurity: Not on file  Transportation Needs: Not on file  Physical Activity: Not on file  Stress: Not on file  Social Connections: Not on file  Intimate Partner Violence: Not on file     Review of Systems   Gen: Denies any fever, chills, fatigue, weight loss, lack of appetite.  CV: Denies chest pain, heart palpitations, peripheral edema, syncope.  Resp: Denies shortness of breath at rest or with exertion. Denies wheezing or cough.  GI: see  HPI GU : Denies urinary burning, urinary frequency, urinary hesitancy MS: Denies joint pain, muscle weakness, cramps, or limitation of movement.  Derm: Denies rash, itching, dry skin Psych: Denies depression, anxiety, memory loss, and confusion Heme: Denies bruising, bleeding, and enlarged lymph nodes.  Physical Exam   BP 110/72   Pulse 62   Ht 5\' 6"  (1.676 m)   Wt 185 lb 3.2 oz (84 kg)   BMI 29.89 kg/m   General:   Alert and oriented. Pleasant and cooperative. Well-nourished and well-developed.  Head:  Normocephalic and atraumatic. Eyes:  Without icterus, sclera clear and conjunctiva pink.  Ears:  Normal auditory acuity. Mouth:  No deformity or lesions, oral mucosa pink.  Lungs:  Clear to auscultation bilaterally. No wheezes, rales, or rhonchi. No distress.  Heart:  S1, S2 present without murmurs appreciated.  Abdomen:   +BS, soft, non-tender and non-distended. No HSM noted. No guarding or rebound. No masses appreciated.  Rectal:  Deferred  Msk:  Symmetrical without gross deformities. Normal posture. Extremities:  Without edema. Neurologic:  Alert and  oriented x4;  grossly normal neurologically. Skin:  Intact without significant lesions or rashes. Psych:  Alert and cooperative. Normal mood and affect.  Assessment   Patricia Glover is a 63 y.o. female with a history of anxiety, asthma, HTN, HLD, hypothyroidism, IBS, and recurrent diverticulitis presenting today to discuss abdominal pain and schedule colonoscopy.   Chronic diarrhea, RLQ abdominal pain: Has been having right lower quadrant abdominal pain as well as chronic diarrhea ongoing for over a year.  Pain is described as a sharp and stabbing pain and not a cramp.  Pain does not improve with bowel movements and can have no rhyme or reason to when it occurs.  Patient unsure if this could have been diverticulitis.  And I would tolerate oral veggies and other certain foods.  Symptoms more consistent with IBS with diarrhea which could be exacerbated recently by increased stress.  She does have a history of abdominal surgeries and states that her pain is very similar to when she had endometriosis pain prior to her hysterectomy.  Other differentials include endometrial tissue, scar tissue, diverticulitis, chronic appendicitis (although less likely, negative McBurney's point).  Given her history I have recommended a CT scan for further evaluation.  Her pain has improved with dicyclomine therefore I advised her to continue this and if needed she can increase to 3 times a day and continue to watch for constipation.  She is currently receiving a course of Cipro from her primary care and did notice some improvement of her pain with using dicyclomine and Cipro together instead of dicyclomine alone.  She is currently due for colon cancer screening given last colonoscopy in 2013.   Patient has agreed to undergo colonoscopy.  Advised patient that this colonoscopy would likely be diagnostic given her diarrhea and abdominal symptoms even though she is due for screening.  Advised a low FODMAP diet.  GERD, dysphagia: Has chronic intermittent reflux symptoms, has been taking omeprazole 20 mg once daily in the evenings and still having some recurrent symptoms.  Also has reported some voice hoarseness and feels as though this is related to her reflux.  She does have asthma and sleep apnea and uses a CPAP machine at home.  Denies any odynophagia but does feel like a slow-moving food down her esophagus which can cause her some discomfort.  Will increase her omeprazole to 40 mg once daily.  Advised on dysphagia precautions.  Screening for  colon cancer: Last colonoscopy in 2013 was completely normal.  Denies any family history of colon cancer, only has family history of ovarian cancer in her sister which was newly diagnosed.  History of stomach cancer in her maternal grandmother.  PLAN   CT A/P with contrast Dicyclomine 10 mg - increase to TID  Finish Cipro course  Proceed with EGD with dilation and colonoscopy with propofol by Dr. Levon Hedger in near future: the risks, benefits, and alternatives have been discussed with the patient in detail. The patient states understanding and desires to proceed. ASA 3 Hold Imodium and dicyclomine for 4 days prior. Omeprazole 40 mg once daily.  Dysphagia precaution Avoid roughage Low FODMAP diet, avoid dairy Follow up post colonoscopy    Brooke Bonito, MSN, FNP-BC, AGACNP-BC [facility]  I have reviewed the note and agree with the APP's assessment as described in this progress note  Katrinka Blazing, MD Gastroenterology and Hepatology [facility]

## 2023-07-14 ENCOUNTER — Encounter: Payer: Self-pay | Admitting: Gastroenterology

## 2023-07-14 NOTE — Telephone Encounter (Signed)
[facility]   CT scheduled for [facility] on Wednesday 08/19/23, arrive at 7:45 am.  TCS/EGD w/Dr.Castaneda, asa 3

## 2023-07-16 ENCOUNTER — Other Ambulatory Visit: Payer: Self-pay

## 2023-07-16 ENCOUNTER — Other Ambulatory Visit: Payer: Self-pay | Admitting: *Deleted

## 2023-07-16 ENCOUNTER — Encounter: Payer: Self-pay | Admitting: *Deleted

## 2023-07-16 MED ORDER — METOPROLOL SUCCINATE ER 25 MG PO TB24
ORAL_TABLET | ORAL | 1 refills | Status: DC
  Filled 2023-07-16: qty 270, 90d supply, fill #0

## 2023-07-16 MED ORDER — METOPROLOL SUCCINATE ER 25 MG PO TB24
ORAL_TABLET | ORAL | 1 refills | Status: DC
  Filled 2023-08-24: qty 270, 90d supply, fill #0

## 2023-07-16 MED ORDER — METOPROLOL SUCCINATE ER 25 MG PO TB24: ORAL_TABLET | ORAL | 3 refills | Status: DC

## 2023-07-16 MED ORDER — PEG 3350-KCL-NA BICARB-NACL 420 G PO SOLR
4000.0000 mL | Freq: Once | ORAL | 0 refills | Status: AC
  Filled 2023-07-16 – 2023-07-28 (×2): qty 4000, 1d supply, fill #0

## 2023-07-16 NOTE — Telephone Encounter (Signed)
Called pt and she asked to be called back this afternoon because she was at work

## 2023-07-16 NOTE — Telephone Encounter (Signed)
Pt informed of CT and she states she works at [employer] and would prefer to go there.   Pt informed that CT is scheduled for 07/23/23, arrive at 12:15 pm to check in at out pt imaging at [facility]. Also scheduled for TCS/EGD for 08/21/23. Instructions sent via mychart and prep sent to the pharmacy.

## 2023-07-23 ENCOUNTER — Other Ambulatory Visit: Payer: Self-pay

## 2023-07-23 ENCOUNTER — Ambulatory Visit: Admission: RE | Admit: 2023-07-23 | Payer: Commercial Managed Care - PPO | Source: Ambulatory Visit

## 2023-07-24 DIAGNOSIS — J45909 Unspecified asthma, uncomplicated: Secondary | ICD-10-CM | POA: Diagnosis not present

## 2023-07-24 DIAGNOSIS — J069 Acute upper respiratory infection, unspecified: Secondary | ICD-10-CM | POA: Diagnosis not present

## 2023-07-24 DIAGNOSIS — Z299 Encounter for prophylactic measures, unspecified: Secondary | ICD-10-CM | POA: Diagnosis not present

## 2023-07-24 DIAGNOSIS — R5383 Other fatigue: Secondary | ICD-10-CM | POA: Diagnosis not present

## 2023-07-27 ENCOUNTER — Ambulatory Visit: Admission: RE | Admit: 2023-07-27 | Payer: Commercial Managed Care - PPO | Source: Ambulatory Visit

## 2023-07-28 ENCOUNTER — Other Ambulatory Visit: Payer: Self-pay

## 2023-07-28 ENCOUNTER — Ambulatory Visit
Admission: RE | Admit: 2023-07-28 | Discharge: 2023-07-28 | Disposition: A | Discharge status: Home / Self Care | Payer: Commercial Managed Care - PPO | Source: Ambulatory Visit | Attending: Gastroenterology | Admitting: Gastroenterology

## 2023-07-28 DIAGNOSIS — D1803 Hemangioma of intra-abdominal structures: Secondary | ICD-10-CM | POA: Diagnosis not present

## 2023-07-28 DIAGNOSIS — Z90710 Acquired absence of both cervix and uterus: Secondary | ICD-10-CM | POA: Diagnosis not present

## 2023-07-28 DIAGNOSIS — R1031 Right lower quadrant pain: Secondary | ICD-10-CM | POA: Diagnosis not present

## 2023-07-28 DIAGNOSIS — Z9049 Acquired absence of other specified parts of digestive tract: Secondary | ICD-10-CM | POA: Diagnosis not present

## 2023-07-28 MED ORDER — IOHEXOL 300 MG/ML  SOLN
100.0000 mL | Freq: Once | INTRAMUSCULAR | Status: AC | PRN
  Administered 2023-07-28: 100 mL via INTRAVENOUS

## 2023-07-29 DIAGNOSIS — R52 Pain, unspecified: Secondary | ICD-10-CM | POA: Diagnosis not present

## 2023-07-29 DIAGNOSIS — J45909 Unspecified asthma, uncomplicated: Secondary | ICD-10-CM | POA: Diagnosis not present

## 2023-07-29 DIAGNOSIS — J40 Bronchitis, not specified as acute or chronic: Secondary | ICD-10-CM | POA: Diagnosis not present

## 2023-07-29 DIAGNOSIS — Z299 Encounter for prophylactic measures, unspecified: Secondary | ICD-10-CM | POA: Diagnosis not present

## 2023-07-29 DIAGNOSIS — R5383 Other fatigue: Secondary | ICD-10-CM | POA: Diagnosis not present

## 2023-07-30 DIAGNOSIS — J40 Bronchitis, not specified as acute or chronic: Secondary | ICD-10-CM | POA: Diagnosis not present

## 2023-07-30 DIAGNOSIS — Z299 Encounter for prophylactic measures, unspecified: Secondary | ICD-10-CM | POA: Diagnosis not present

## 2023-07-30 DIAGNOSIS — I10 Essential (primary) hypertension: Secondary | ICD-10-CM | POA: Diagnosis not present

## 2023-07-31 DIAGNOSIS — J40 Bronchitis, not specified as acute or chronic: Secondary | ICD-10-CM | POA: Diagnosis not present

## 2023-07-31 DIAGNOSIS — Z299 Encounter for prophylactic measures, unspecified: Secondary | ICD-10-CM | POA: Diagnosis not present

## 2023-07-31 DIAGNOSIS — I10 Essential (primary) hypertension: Secondary | ICD-10-CM | POA: Diagnosis not present

## 2023-08-03 ENCOUNTER — Other Ambulatory Visit: Payer: Self-pay

## 2023-08-05 DIAGNOSIS — G4733 Obstructive sleep apnea (adult) (pediatric): Secondary | ICD-10-CM | POA: Diagnosis not present

## 2023-08-07 DIAGNOSIS — H524 Presbyopia: Secondary | ICD-10-CM | POA: Diagnosis not present

## 2023-08-17 ENCOUNTER — Other Ambulatory Visit: Payer: Self-pay

## 2023-08-17 MED ORDER — CLONIDINE HCL 0.1 MG PO TABS: 0.2000 mg | ORAL_TABLET | Freq: Two times a day (BID) | ORAL | 2 refills | Status: DC

## 2023-08-17 MED ORDER — CLONIDINE HCL 0.1 MG PO TABS
0.2000 mg | ORAL_TABLET | Freq: Two times a day (BID) | ORAL | 2 refills | Status: DC
  Filled 2023-08-17: qty 120, 30d supply, fill #0
  Filled 2023-09-07 – 2023-09-10 (×2): qty 120, 30d supply, fill #1
  Filled 2023-10-13: qty 120, 30d supply, fill #2

## 2023-08-18 NOTE — Patient Instructions (Signed)
 Patricia Glover  08/18/2023     @PREFPERIOPPHARMACY @   Your procedure is scheduled on  08/21/2023.   Report to [facility] at  1100  A.M.   Call this number if you have problems the morning of surgery:  [phone]  If you experience any cold or flu symptoms such as cough, fever, chills, shortness of breath, etc. between now and your scheduled surgery, please notify us at the above number.   Remember:        Use your inhaler before you come and bring your rescue inhaler with you.   Follow the diet and prep instructions given to you by the office.   You may drink clear liquids until 0900 am on 08/21/2023.     Clear liquids allowed are:                    Water, Juice (No red color; non-citric and without pulp; diabetics please choose diet or no sugar options), Carbonated beverages (diabetics please choose diet or no sugar options), Clear Tea (No creamer, milk, or cream, including half & half and powdered creamer), Black Coffee Only (No creamer, milk or cream, including half & half and powdered creamer), and Clear Sports drink (No red color; diabetics please choose diet or no sugar options)    Take these medicines the morning of surgery with A SIP OF WATER        clonidine, duloxetine, metoprolol, omeprazole, sumatriptan (if needed), thyroid, zofran (if needed).    Do not wear jewelry, make-up or nail polish, including gel polish,  artificial nails, or any other type of covering on natural nails (fingers and  toes).  Do not wear lotions, powders, or perfumes, or deodorant.  Do not shave 48 hours prior to surgery.  Men may shave face and neck.  Do not bring valuables to the hospital.  [facility] is not responsible for any belongings or valuables.  Contacts, dentures or bridgework may not be worn into surgery.  Leave your suitcase in the car.  After surgery it may be brought to your room.  For patients admitted to the hospital, discharge time will be determined by your  treatment team.  Patients discharged the day of surgery will not be allowed to drive home and must  have someone with them for 24 hours.    Special instructions:   DO NOT smoke tobacco or vape for 24 hours before your procedure.  Please read over the following fact sheets that you were given. Anesthesia Post-op Instructions and Care and Recovery After Surgery      Upper Endoscopy, Adult, Care After After the procedure, it is common to have a sore throat. It is also common to have: Mild stomach pain or discomfort. Bloating. Nausea. Follow these instructions at home: The instructions below may help you care for yourself at home. Your health care provider may give you more instructions. If you have questions, ask your health care provider. If you were given a sedative during the procedure, it can affect you for several hours. Do not drive or operate machinery until your health care provider says that it is safe. If you will be going home right after the procedure, plan to have a responsible adult: Take you home from the hospital or clinic. You will not be allowed to drive. Care for you for the time you are told. Follow instructions from your health care provider about what you may eat  and drink. Return to your normal activities as told by your health care provider. Ask your health care provider what activities are safe for you. Take over-the-counter and prescription medicines only as told by your health care provider. Contact a health care provider if you: Have a sore throat that lasts longer than one day. Have trouble swallowing. Have a fever. Get help right away if you: Vomit blood or your vomit looks like coffee grounds. Have bloody, black, or tarry stools. Have a very bad sore throat or you cannot swallow. Have difficulty breathing or very bad pain in your chest or abdomen. These symptoms may be an emergency. Get help right away. Call 911. Do not wait to see if the symptoms will  go away. Do not drive yourself to the hospital. Summary After the procedure, it is common to have a sore throat, mild stomach discomfort, bloating, and nausea. If you were given a sedative during the procedure, it can affect you for several hours. Do not drive until your health care provider says that it is safe. Follow instructions from your health care provider about what you may eat and drink. Return to your normal activities as told by your health care provider. This information is not intended to replace advice given to you by your health care provider. Make sure you discuss any questions you have with your health care provider. Document Revised: 09/11/2021 Document Reviewed: 09/11/2021 Elsevier Patient Education  2024 [employer].Colonoscopy, Adult, Care After The following information offers guidance on how to care for yourself after your procedure. Your health care provider may also give you more specific instructions. If you have problems or questions, contact your health care provider. What can I expect after the procedure? After the procedure, it is common to have: A small amount of blood in your stool for 24 hours after the procedure. Some gas. Mild cramping or bloating of your abdomen. Follow these instructions at home: Eating and drinking  Drink enough fluid to keep your urine pale yellow. Follow instructions from your health care provider about eating or drinking restrictions. Resume your normal diet as told by your health care provider. Avoid heavy or fried foods that are hard to digest. Activity Rest as told by your health care provider. Avoid sitting for a long time without moving. Get up to take short walks every 1-2 hours. This is important to improve blood flow and breathing. Ask for help if you feel weak or unsteady. Return to your normal activities as told by your health care provider. Ask your health care provider what activities are safe for you. Managing cramping  and bloating  Try walking around when you have cramps or feel bloated. If directed, apply heat to your abdomen as told by your health care provider. Use the heat source that your health care provider recommends, such as a moist heat pack or a heating pad. Place a towel between your skin and the heat source. Leave the heat on for 20-30 minutes. Remove the heat if your skin turns bright red. This is especially important if you are unable to feel pain, heat, or cold. You have a greater risk of getting burned. General instructions If you were given a sedative during the procedure, it can affect you for several hours. Do not drive or operate machinery until your health care provider says that it is safe. For the first 24 hours after the procedure: Do not sign important documents. Do not drink alcohol. Do your regular daily activities at  a slower pace than normal. Eat soft foods that are easy to digest. Take over-the-counter and prescription medicines only as told by your health care provider. Keep all follow-up visits. This is important. Contact a health care provider if: You have blood in your stool 2-3 days after the procedure. Get help right away if: You have more than a small spotting of blood in your stool. You have large blood clots in your stool. You have swelling of your abdomen. You have nausea or vomiting. You have a fever. You have increasing pain in your abdomen that is not relieved with medicine. These symptoms may be an emergency. Get help right away. Call 911. Do not wait to see if the symptoms will go away. Do not drive yourself to the hospital. Summary After the procedure, it is common to have a small amount of blood in your stool. You may also have mild cramping and bloating of your abdomen. If you were given a sedative during the procedure, it can affect you for several hours. Do not drive or operate machinery until your health care provider says that it is safe. Get help  right away if you have a lot of blood in your stool, nausea or vomiting, a fever, or increased pain in your abdomen. This information is not intended to replace advice given to you by your health care provider. Make sure you discuss any questions you have with your health care provider. Document Revised: 07/15/2022 Document Reviewed: 01/23/2021 Elsevier Patient Education  2024 [employer].General Anesthesia, Adult, Care After The following information offers guidance on how to care for yourself after your procedure. Your health care provider may also give you more specific instructions. If you have problems or questions, contact your health care provider. What can I expect after the procedure? After the procedure, it is common for people to: Have pain or discomfort at the IV site. Have nausea or vomiting. Have a sore throat or hoarseness. Have trouble concentrating. Feel cold or chills. Feel weak, sleepy, or tired (fatigue). Have soreness and body aches. These can affect parts of the body that were not involved in surgery. Follow these instructions at home: For the time period you were told by your health care provider:  Rest. Do not participate in activities where you could fall or become injured. Do not drive or use machinery. Do not drink alcohol. Do not take sleeping pills or medicines that cause drowsiness. Do not make important decisions or sign legal documents. Do not take care of children on your own. General instructions Drink enough fluid to keep your urine pale yellow. If you have sleep apnea, surgery and certain medicines can increase your risk for breathing problems. Follow instructions from your health care provider about wearing your sleep device: Anytime you are sleeping, including during daytime naps. While taking prescription pain medicines, sleeping medicines, or medicines that make you drowsy. Return to your normal activities as told by your health care provider.  Ask your health care provider what activities are safe for you. Take over-the-counter and prescription medicines only as told by your health care provider. Do not use any products that contain nicotine or tobacco. These products include cigarettes, chewing tobacco, and vaping devices, such as e-cigarettes. These can delay incision healing after surgery. If you need help quitting, ask your health care provider. Contact a health care provider if: You have nausea or vomiting that does not get better with medicine. You vomit every time you eat or drink. You have pain  that does not get better with medicine. You cannot urinate or have bloody urine. You develop a skin rash. You have a fever. Get help right away if: You have trouble breathing. You have chest pain. You vomit blood. These symptoms may be an emergency. Get help right away. Call 911. Do not wait to see if the symptoms will go away. Do not drive yourself to the hospital. Summary After the procedure, it is common to have a sore throat, hoarseness, nausea, vomiting, or to feel weak, sleepy, or fatigue. For the time period you were told by your health care provider, do not drive or use machinery. Get help right away if you have difficulty breathing, have chest pain, or vomit blood. These symptoms may be an emergency. This information is not intended to replace advice given to you by your health care provider. Make sure you discuss any questions you have with your health care provider. Document Revised: 08/30/2021 Document Reviewed: 08/30/2021 Elsevier Patient Education  2024 [employer].

## 2023-08-19 ENCOUNTER — Encounter (HOSPITAL_COMMUNITY): Payer: Self-pay

## 2023-08-19 ENCOUNTER — Other Ambulatory Visit (HOSPITAL_COMMUNITY): Payer: Commercial Managed Care - PPO

## 2023-08-19 ENCOUNTER — Other Ambulatory Visit: Payer: Self-pay

## 2023-08-19 ENCOUNTER — Encounter (HOSPITAL_COMMUNITY)
Admission: RE | Admit: 2023-08-19 | Discharge: 2023-08-19 | Disposition: A | Discharge status: Home / Self Care | Payer: Commercial Managed Care - PPO | Source: Ambulatory Visit | Attending: Gastroenterology | Admitting: Gastroenterology

## 2023-08-19 VITALS — BP 131/85 | HR 72 | Temp 98.0°F | Resp 18 | Ht 66.0 in | Wt 185.2 lb

## 2023-08-19 DIAGNOSIS — Z01812 Encounter for preprocedural laboratory examination: Secondary | ICD-10-CM | POA: Insufficient documentation

## 2023-08-19 DIAGNOSIS — Z79899 Other long term (current) drug therapy: Secondary | ICD-10-CM | POA: Insufficient documentation

## 2023-08-19 LAB — BASIC METABOLIC PANEL
Anion gap: 9 (ref 5–15)
BUN: 16 mg/dL (ref 8–23)
CO2: 27 mmol/L (ref 22–32)
Calcium: 9.6 mg/dL (ref 8.9–10.3)
Chloride: 101 mmol/L (ref 98–111)
Creatinine, Ser: 0.65 mg/dL (ref 0.44–1.00)
GFR, Estimated: >60 mL/min (ref >60)
Glucose, Bld: 94 mg/dL (ref 70–99)
Potassium: 3.6 mmol/L (ref 3.5–5.1)
Sodium: 137 mmol/L (ref 135–145)

## 2023-08-21 ENCOUNTER — Encounter (HOSPITAL_COMMUNITY)
Admission: RE | Disposition: A | Discharge status: Home / Self Care | Payer: Self-pay | Source: Home / Self Care | Attending: Gastroenterology

## 2023-08-21 ENCOUNTER — Encounter (HOSPITAL_COMMUNITY): Payer: Self-pay

## 2023-08-21 ENCOUNTER — Ambulatory Visit (HOSPITAL_BASED_OUTPATIENT_CLINIC_OR_DEPARTMENT_OTHER): Admitting: Anesthesiology

## 2023-08-21 ENCOUNTER — Ambulatory Visit (HOSPITAL_COMMUNITY): Admit: 2023-08-21 | Payer: Commercial Managed Care - PPO | Admitting: Gastroenterology

## 2023-08-21 ENCOUNTER — Ambulatory Visit (HOSPITAL_COMMUNITY): Admitting: Anesthesiology

## 2023-08-21 ENCOUNTER — Encounter (HOSPITAL_COMMUNITY): Payer: Self-pay | Admitting: Gastroenterology

## 2023-08-21 ENCOUNTER — Ambulatory Visit (HOSPITAL_COMMUNITY)
Admission: RE | Admit: 2023-08-21 | Discharge: 2023-08-21 | Disposition: A | Discharge status: Home / Self Care | Payer: Commercial Managed Care - PPO | Attending: Gastroenterology | Admitting: Gastroenterology

## 2023-08-21 DIAGNOSIS — E039 Hypothyroidism, unspecified: Secondary | ICD-10-CM | POA: Insufficient documentation

## 2023-08-21 DIAGNOSIS — Z79899 Other long term (current) drug therapy: Secondary | ICD-10-CM | POA: Insufficient documentation

## 2023-08-21 DIAGNOSIS — F419 Anxiety disorder, unspecified: Secondary | ICD-10-CM | POA: Insufficient documentation

## 2023-08-21 DIAGNOSIS — K589 Irritable bowel syndrome without diarrhea: Secondary | ICD-10-CM | POA: Insufficient documentation

## 2023-08-21 DIAGNOSIS — J45909 Unspecified asthma, uncomplicated: Secondary | ICD-10-CM | POA: Diagnosis not present

## 2023-08-21 DIAGNOSIS — R1031 Right lower quadrant pain: Secondary | ICD-10-CM | POA: Diagnosis not present

## 2023-08-21 DIAGNOSIS — I10 Essential (primary) hypertension: Secondary | ICD-10-CM | POA: Insufficient documentation

## 2023-08-21 DIAGNOSIS — R1319 Other dysphagia: Secondary | ICD-10-CM

## 2023-08-21 DIAGNOSIS — K219 Gastro-esophageal reflux disease without esophagitis: Secondary | ICD-10-CM | POA: Diagnosis not present

## 2023-08-21 DIAGNOSIS — R1310 Dysphagia, unspecified: Secondary | ICD-10-CM | POA: Insufficient documentation

## 2023-08-21 DIAGNOSIS — K2289 Other specified disease of esophagus: Secondary | ICD-10-CM

## 2023-08-21 DIAGNOSIS — K529 Noninfective gastroenteritis and colitis, unspecified: Secondary | ICD-10-CM

## 2023-08-21 DIAGNOSIS — Z9049 Acquired absence of other specified parts of digestive tract: Secondary | ICD-10-CM | POA: Insufficient documentation

## 2023-08-21 DIAGNOSIS — K3189 Other diseases of stomach and duodenum: Secondary | ICD-10-CM | POA: Diagnosis not present

## 2023-08-21 DIAGNOSIS — Z1211 Encounter for screening for malignant neoplasm of colon: Secondary | ICD-10-CM

## 2023-08-21 DIAGNOSIS — E785 Hyperlipidemia, unspecified: Secondary | ICD-10-CM | POA: Diagnosis not present

## 2023-08-21 DIAGNOSIS — K5792 Diverticulitis of intestine, part unspecified, without perforation or abscess without bleeding: Secondary | ICD-10-CM | POA: Diagnosis not present

## 2023-08-21 DIAGNOSIS — Z139 Encounter for screening, unspecified: Secondary | ICD-10-CM | POA: Diagnosis not present

## 2023-08-21 DIAGNOSIS — K2970 Gastritis, unspecified, without bleeding: Secondary | ICD-10-CM | POA: Insufficient documentation

## 2023-08-21 DIAGNOSIS — Z79890 Hormone replacement therapy (postmenopausal): Secondary | ICD-10-CM | POA: Insufficient documentation

## 2023-08-21 DIAGNOSIS — R109 Unspecified abdominal pain: Secondary | ICD-10-CM | POA: Insufficient documentation

## 2023-08-21 HISTORY — PX: COLONOSCOPY WITH PROPOFOL: SHX5780

## 2023-08-21 HISTORY — PX: SAVORY DILATION: SHX5439

## 2023-08-21 HISTORY — PX: ESOPHAGOGASTRODUODENOSCOPY (EGD) WITH PROPOFOL: SHX5813

## 2023-08-21 LAB — HM COLONOSCOPY

## 2023-08-21 SURGERY — COLONOSCOPY WITH PROPOFOL
Anesthesia: General

## 2023-08-21 SURGERY — COLONOSCOPY WITH PROPOFOL
Anesthesia: Monitor Anesthesia Care

## 2023-08-21 MED ORDER — PROPOFOL 500 MG/50ML IV EMUL
INTRAVENOUS | Status: DC | PRN
Start: 2023-08-21 — End: 2023-08-21
  Administered 2023-08-21: 150 ug/kg/min via INTRAVENOUS

## 2023-08-21 MED ORDER — PROPOFOL 10 MG/ML IV BOLUS
INTRAVENOUS | Status: DC | PRN
  Administered 2023-08-21: 100 mg via INTRAVENOUS
  Administered 2023-08-21 (×2): 50 mg via INTRAVENOUS

## 2023-08-21 MED ORDER — LACTATED RINGERS IV SOLN: INTRAVENOUS | Status: DC | PRN

## 2023-08-21 MED ORDER — DEXMEDETOMIDINE HCL IN NACL 80 MCG/20ML IV SOLN
INTRAVENOUS | Status: DC | PRN
  Administered 2023-08-21: 12 ug via INTRAVENOUS
  Administered 2023-08-21: 8 ug via INTRAVENOUS

## 2023-08-21 MED ORDER — KETAMINE HCL 50 MG/5ML IJ SOSY
PREFILLED_SYRINGE | INTRAMUSCULAR | Status: DC | PRN
  Administered 2023-08-21 (×2): 25 mg via INTRAVENOUS

## 2023-08-21 MED ORDER — PHENYLEPHRINE 80 MCG/ML (10ML) SYRINGE FOR IV PUSH (FOR BLOOD PRESSURE SUPPORT)
PREFILLED_SYRINGE | INTRAVENOUS | Status: AC
  Filled 2023-08-21: qty 10

## 2023-08-21 MED ORDER — KETAMINE HCL 50 MG/5ML IJ SOSY
PREFILLED_SYRINGE | INTRAMUSCULAR | Status: AC
  Filled 2023-08-21: qty 5

## 2023-08-21 MED ORDER — PHENYLEPHRINE 80 MCG/ML (10ML) SYRINGE FOR IV PUSH (FOR BLOOD PRESSURE SUPPORT)
PREFILLED_SYRINGE | INTRAVENOUS | Status: DC | PRN
  Administered 2023-08-21: 160 ug via INTRAVENOUS

## 2023-08-21 MED ORDER — LIDOCAINE HCL (PF) 2 % IJ SOLN
INTRAMUSCULAR | Status: DC | PRN
  Administered 2023-08-21: 100 mg via INTRADERMAL

## 2023-08-21 NOTE — Anesthesia Preprocedure Evaluation (Addendum)
 Anesthesia Evaluation  Patient identified by MRN, date of birth, ID band Patient awake    Reviewed: Allergy & Precautions, H&P , NPO status , Patient's Chart, lab work & pertinent test results, reviewed documented beta blocker date and time   History of Anesthesia Complications (+) history of anesthetic complications  Airway Mallampati: II  TM Distance: >3 FB Neck ROM: full    Dental no notable dental hx. (+) Caps, Dental Advisory Given   Pulmonary neg pulmonary ROS   Pulmonary exam normal breath sounds clear to auscultation       Cardiovascular Exercise Tolerance: Good hypertension, negative cardio ROS  Rhythm:regular Rate:Normal     Neuro/Psych  Headaches  negative psych ROS   GI/Hepatic negative GI ROS, Neg liver ROS,,,  Endo/Other  Hypothyroidism    Renal/GU negative Renal ROS  negative genitourinary   Musculoskeletal   Abdominal   Peds  Hematology negative hematology ROS (+)   Anesthesia Other Findings   Reproductive/Obstetrics negative OB ROS                             Anesthesia Physical Anesthesia Plan  ASA: 3  Anesthesia Plan: General   Post-op Pain Management:    Induction:   PONV Risk Score and Plan: Propofol infusion  Airway Management Planned:   Additional Equipment:   Intra-op Plan:   Post-operative Plan:   Informed Consent: I have reviewed the patients History and Physical, chart, labs and discussed the procedure including the risks, benefits and alternatives for the proposed anesthesia with the patient or authorized representative who has indicated his/her understanding and acceptance.     Dental Advisory Given  Plan Discussed with: CRNA  Anesthesia Plan Comments:        Anesthesia Quick Evaluation

## 2023-08-21 NOTE — Op Note (Signed)
 [facility] Patient Name: Patricia Glover Procedure Date: 08/21/2023 12:43 PM MRN: [id number] Date of Birth: [birth year] Attending MD: Katrinka Blazing , , [id number] CSN: [id number] Age: 63 Admit Type: Outpatient Procedure:                Colonoscopy Indications:              Abdominal pain, Chronic diarrhea Providers:                Katrinka Blazing, Francoise Ceo RN, RN, Dyann Ruddle Referring MD:              Medicines:                Monitored Anesthesia Care Complications:            No immediate complications. Estimated Blood Loss:     Estimated blood loss: none. Procedure:                Pre-Anesthesia Assessment:                           - Prior to the procedure, a History and Physical                            was performed, and patient medications, allergies                            and sensitivities were reviewed. The patient's                            tolerance of previous anesthesia was reviewed.                           - The risks and benefits of the procedure and the                            sedation options and risks were discussed with the                            patient. All questions were answered and informed                            consent was obtained.                           - ASA Grade Assessment: II - A patient with mild                            systemic disease.                           After obtaining informed consent, the colonoscope                            was passed under direct vision. Throughout the                            procedure, the patient's blood  pressure, pulse, and                            oxygen saturations were monitored continuously. The                            [id number]-HQ190L ([id number]) scope was introduced through                            the anus and advanced to the the terminal ileum.                            The colonoscopy was performed without difficulty.                            The patient tolerated  the procedure well. The                            quality of the bowel preparation was excellent. Scope In: 12:45:51 PM Scope Out: 12:57:38 PM Scope Withdrawal Time: 0 hours 8 minutes 40 seconds  Total Procedure Duration: 0 hours 11 minutes 47 seconds  Findings:      The perianal and digital rectal examinations were normal.      The terminal ileum appeared normal.      The colon (entire examined portion) appeared normal. Biopsies for       histology were taken with a cold forceps from the right colon and left       colon for evaluation of microscopic colitis.      The retroflexed view of the distal rectum and anal verge was normal and       showed no anal or rectal abnormalities. Impression:               - The examined portion of the ileum was normal.                           - The entire examined colon is normal. Biopsied.                           - The distal rectum and anal verge are normal on                            retroflexion view. Moderate Sedation:      Per Anesthesia Care Recommendation:           - Discharge patient to home (ambulatory).                           - Resume previous diet.                           - Repeat colonoscopy in 10 years for screening                            purposes.                           -  Await pathology results.                           - Return to GI clinic in 4 weeks. Procedure Code(s):        --- Professional ---                           [phone], Colonoscopy, flexible; with biopsy, single                            or multiple Diagnosis Code(s):        --- Professional ---                           R10.9, Unspecified abdominal pain                           K52.9, Noninfective gastroenteritis and colitis,                            unspecified CPT copyright 2022 American Medical Association. All rights reserved. The codes documented in this report are preliminary and upon coder review may  be revised to meet current compliance  requirements. Katrinka Blazing, MD Katrinka Blazing,  08/21/2023 1:07:03 PM This report has been signed electronically. Number of Addenda: 0

## 2023-08-21 NOTE — Op Note (Signed)
 [facility] Patient Name: Patricia Glover Procedure Date: 08/21/2023 12:09 PM MRN: [id number] Date of Birth: 1960-12-02 Attending MD: Katrinka Blazing , , [id number] CSN: [id number] Age: 63 Admit Type: Outpatient Procedure:                Upper GI endoscopy Indications:              Dysphagia Providers:                Katrinka Blazing, Angelica Ran, Kristine L. Jessee Avers, Technician Referring MD:              Medicines:                Monitored Anesthesia Care Complications:            No immediate complications. Estimated Blood Loss:     Estimated blood loss: none. Procedure:                Pre-Anesthesia Assessment:                           - Prior to the procedure, a History and Physical                            was performed, and patient medications, allergies                            and sensitivities were reviewed. The patient's                            tolerance of previous anesthesia was reviewed.                           - The risks and benefits of the procedure and the                            sedation options and risks were discussed with the                            patient. All questions were answered and informed                            consent was obtained.                           - ASA Grade Assessment: II - A patient with mild                            systemic disease.                           After obtaining informed consent, the endoscope was                            passed under direct vision. Throughout the  procedure, the patient's blood pressure, pulse, and                            oxygen saturations were monitored continuously. The                            [id number]-[id number] ([id number]) scope was introduced through the                            mouth, and advanced to the second part of duodenum.                            The upper GI endoscopy was accomplished without                             difficulty. The patient tolerated the procedure                            well. Scope In: 12:30:53 PM Scope Out: 12:41:08 PM Total Procedure Duration: 0 hours 10 minutes 15 seconds  Findings:      No endoscopic abnormality was evident in the esophagus to explain the       patient's complaint of dysphagia. It was decided, however, to proceed       with dilation of the entire esophagus. A guidewire was placed and the       scope was withdrawn. Dilation was performed with a Savary dilator with       no resistance at 18 mm. The dilation site was examined following       endoscope reinsertion and showed no change. Biopsies were obtained from       the proximal and distal esophagus with cold forceps for histology of       eosinophilic esophagitis.      A single localized spot (?healed ulcer) was found in the gastric antrum.      The examined duodenum was normal. Impression:               - No endoscopic esophageal abnormality to explain                            patient's dysphagia. Esophagus dilated. Dilated.                           - A single spot in the stomach.                           - Normal examined duodenum.                           - Biopsies were taken with a cold forceps for                            evaluation of eosinophilic esophagitis. Moderate Sedation:      Per Anesthesia Care Recommendation:           - Discharge patient to home (ambulatory).                           -  Resume previous diet.                           - Await pathology results. Procedure Code(s):        --- Professional ---                           [phone], Esophagogastroduodenoscopy, flexible,                            transoral; with insertion of guide wire followed by                            passage of dilator(s) through esophagus over guide                            wire                           [id number], 59, Esophagogastroduodenoscopy, flexible,                            transoral; with  biopsy, single or multiple Diagnosis Code(s):        --- Professional ---                           R13.10, Dysphagia, unspecified                           K31.89, Other diseases of stomach and duodenum CPT copyright 2022 American Medical Association. All rights reserved. The codes documented in this report are preliminary and upon coder review may  be revised to meet current compliance requirements. Katrinka Blazing, MD Katrinka Blazing,  08/21/2023 1:03:37 PM This report has been signed electronically. Number of Addenda: 0

## 2023-08-21 NOTE — Transfer of Care (Signed)
 Immediate Anesthesia Transfer of Care Note  Patient: Patricia Glover  Procedure(s) Performed: COLONOSCOPY WITH PROPOFOL ESOPHAGOGASTRODUODENOSCOPY (EGD) WITH PROPOFOL EGD, WITH DILATION USING SAVARY-GILLIARD DILATOR OVER GUIDEWIRE  Patient Location: Short Stay  Anesthesia Type:General  Level of Consciousness: drowsy  Airway & Oxygen Therapy: Patient Spontanous Breathing  Post-op Assessment: Report given to RN and Post -op Vital signs reviewed and stable  Post vital signs: Reviewed and stable  Last Vitals:  Vitals Value Taken Time  BP 100/65 08/21/23 1306  Temp 36.4 C 08/21/23 1306  Pulse 57 08/21/23 1306  Resp    SpO2 98 % 08/21/23 1306    Last Pain:  Vitals:   08/21/23 1306  TempSrc: Oral  PainSc: 0-No pain         Complications: No notable events documented.

## 2023-08-21 NOTE — H&P (Addendum)
 Patricia Glover is an 63 y.o. female.   Chief Complaint: diarrhea, abdominal pain, dysphagia HPI: Patricia Glover is a 63 y.o. female with a history of anxiety, asthma, HTN, HLD, hypothyroidism, IBS, and recurrent diverticulitis, who comes for evaluation of diarrhea, abdominal pain, dysphagia.  States sometimes she has difficulty swallowing some food,  no heartburn.  Also has presented recurrent episodes of diarrhea on a regular basis.  Has had chronic intermittent right lower quadrant abdominal pain for several years. The patient denies having any nausea, vomiting, fever, chills, hematochezia, melena, hematemesis, abdominal distention, jaundice, pruritus or weight loss.   Past Medical History:  Diagnosis Date   Complication of anesthesia    nausea, vomiting   Hypothyroid    Migraines     Past Surgical History:  Procedure Laterality Date   CHOLECYSTECTOMY     KNEE SURGERY     PILONIDAL CYST EXCISION     35 yrs ago   TOTAL ABDOMINAL HYSTERECTOMY      Family History  Problem Relation Age of Onset   Hyperlipidemia Mother    Malignant hypertension Mother    Asthma Sister    Stomach cancer Maternal Grandmother    Heart attack Maternal Grandfather 57   Breast cancer Neg Hx    Social History:  reports that she has never smoked. She does not have any smokeless tobacco history on file. She reports current alcohol use. She reports that she does not use drugs.  Allergies:  Allergies  Allergen Reactions   Meperidine Hcl Nausea And Vomiting   Morphine Nausea And Vomiting   Morphine And Codeine Nausea And Vomiting   Meperidine Nausea And Vomiting    Medications Prior to Admission  Medication Sig Dispense Refill   calcium carbonate (OSCAL) 1500 (600 Ca) MG TABS tablet Take 1 tablet by mouth daily.     clonazePAM (KLONOPIN) 0.5 MG tablet Take 1 tablet (0.5 mg total) by mouth 3 (three) times daily as needed. 21 tablet 0   cloNIDine (CATAPRES) 0.1 MG tablet Take 2 tablets (0.2 mg  total) by mouth 2 (two) times daily. 120 tablet 2   co-enzyme Q-10 30 MG capsule Take 30 mg by mouth 3 (three) times daily.     DULoxetine (CYMBALTA) 60 MG capsule Take 1 capsule (60 mg total) by mouth in the morning. 90 capsule 3   hydrochlorothiazide (HYDRODIURIL) 25 MG tablet Take 1 tablet (25 mg total) by mouth every morning. 90 tablet 3   levothyroxine (SYNTHROID) 88 MCG tablet Take 1 tablet (88 mcg total) by mouth daily. 90 tablet 1   losartan (COZAAR) 100 MG tablet Take 1 tablet (100 mg total) by mouth in the morning. 90 tablet 2   metoprolol succinate (TOPROL-XL) 25 MG 24 hr tablet Take 2 tablets (50 mg) by mouth every morning and 1 tab (25 mg) every evening. 270 tablet 1   omeprazole (PRILOSEC) 40 MG capsule Take 1 capsule (40 mg total) by mouth daily. 90 capsule 3   rosuvastatin (CRESTOR) 5 MG tablet Take 1 tablet by mouth once daily. 90 tablet 1   Budeson-Glycopyrrol-Formoterol (BREZTRI AEROSPHERE) 160-9-4.8 MCG/ACT AERO Inhale 2 puffs by mouth twice daily. 10.7 g 1   ciprofloxacin (CIPRO) 500 MG tablet Take 1 tablet (500 mg total) by mouth 2 (two) times daily. 20 tablet 0   cloNIDine (CATAPRES) 0.1 MG tablet Take 2 tablets (0.2 mg total) by mouth 2 (two) times daily. 120 tablet 2   cloNIDine (CATAPRES) 0.1 MG tablet Take 2 tablets (0.2  mg total) by mouth 2 (two) times daily. 120 tablet 2   diclofenac Sodium (VOLTAREN) 1 % GEL Apply topically. (Patient not taking: Reported on 07/13/2023)     dicyclomine (BENTYL) 10 MG capsule Take 1 capsule (10 mg total) by mouth every 6 (six) hours. 40 capsule 0   famotidine (PEPCID) 40 MG tablet TAKE 1 TABLET BY MOUTH DAILY 30 tablet 1   ketorolac (TORADOL) 10 MG tablet Take 10 mg by mouth every 6 (six) hours as needed. For migraines (Patient not taking: Reported on 07/13/2023)     losartan (COZAAR) 100 MG tablet Take 1 qam Tablet by mouth daily 60 tablet 12   MAGNESIUM BISGLYCINATE PO Take by mouth.     metoprolol succinate (TOPROL-XL) 25 MG 24 hr  tablet Take 25 mg by mouth in the morning and at bedtime.     metoprolol succinate (TOPROL-XL) 25 MG 24 hr tablet Take 2 tablets (50 mg total) by mouth daily AND 1 tablet (25 mg total) at bedtime. 270 tablet 1   mirabegron ER (MYRBETRIQ) 25 MG TB24 tablet TAKE 1 TABLET BY MOUTH ONCE DAILY (Patient not taking: Reported on 08/27/2022) 30 tablet 5   Omega-3 1000 MG CAPS Take by mouth.     ondansetron (ZOFRAN) 4 MG tablet Take 1 tablet (4 mg total) by mouth 4 (four) times daily as needed. 40 tablet 1   oxyCODONE-acetaminophen (PERCOCET) 10-325 MG per tablet Take 1 tablet by mouth every 4 (four) hours as needed. For severe migraines (Patient not taking: Reported on 08/27/2022)     rifaximin (XIFAXAN) 200 MG tablet Take 1 tablet (200 mg total) by mouth 3 (three) times daily as needed. (Patient not taking: Reported on 07/13/2023) 90 tablet 0   scopolamine (TRANSDERM-SCOP) 1 MG/3DAYS Place 1 patch (1.5 mg total) behind ear every 3 (three) days as needed. Apply 4 hours prior to traveling 3 patch 0   SUMAtriptan (IMITREX) 100 MG tablet 1 (one) Tablet as directed prn migraine headache 10 tablet 5   thyroid (ARMOUR THYROID) 60 MG tablet Take 1 tablet (60 mg total) by mouth daily. 90 tablet 2    Results for orders placed or performed during the hospital encounter of 08/19/23 (from the past 48 hours)  Basic metabolic panel     Status: None   Collection Time: 08/19/23  1:14 PM  Result Value Ref Range   Sodium 137 135 - 145 mmol/L   Potassium 3.6 3.5 - 5.1 mmol/L   Chloride 101 98 - 111 mmol/L   CO2 27 22 - 32 mmol/L   Glucose, Bld 94 70 - 99 mg/dL    Comment: Glucose reference range applies only to samples taken after fasting for at least 8 hours.   BUN 16 8 - 23 mg/dL   Creatinine, Ser 6.04 0.44 - 1.00 mg/dL   Calcium 9.6 8.9 - 54.0 mg/dL   GFR, Estimated >98 >11 mL/min    Comment: (NOTE) Calculated using the CKD-EPI Creatinine Equation (2021)    Anion gap 9 5 - 15    Comment: Performed at [facility], [address], [place], [place] [address]   No results found.  Review of Systems  HENT:  Positive for trouble swallowing.   Gastrointestinal:  Positive for abdominal pain and diarrhea.  All other systems reviewed and are negative.   Blood pressure (!) 130/94, temperature 98.6 F (37 C), resp. rate 12, SpO2 98%. Physical Exam  GENERAL: The patient is AO x3, in no acute distress. HEENT: Head  is normocephalic and atraumatic. EOMI are intact. Mouth is well hydrated and without lesions. NECK: Supple. No masses LUNGS: Clear to auscultation. No presence of rhonchi/wheezing/rales. Adequate chest expansion HEART: RRR, normal s1 and s2. ABDOMEN: Soft, nontender, no guarding, no peritoneal signs, and nondistended. BS +. No masses. EXTREMITIES: Without any cyanosis, clubbing, rash, lesions or edema. NEUROLOGIC: AOx3, no focal motor deficit. SKIN: no jaundice, no rashes  Assessment/Plan MYANNA ZIESMER is a 63 y.o. female with a history of anxiety, asthma, HTN, HLD, hypothyroidism, IBS, and recurrent diverticulitis, who comes for evaluation of diarrhea, abdominal pain, dysphagia.  Will proceed with EGD and colonoscopy.  Dolores Frame, MD 08/21/2023, 12:04 PM

## 2023-08-21 NOTE — Discharge Instructions (Addendum)
 You are being discharged to home.  Resume your previous diet.  Your physician has recommended a repeat colonoscopy in 10 years for screening purposes.  We are waiting for your pathology results.  Return to your GI clinic in four weeks.

## 2023-08-21 NOTE — Anesthesia Procedure Notes (Signed)
 Date/Time: 08/21/2023 12:26 PM  Performed by: Julian Reil, CRNAPre-anesthesia Checklist: Patient identified, Emergency Drugs available, Suction available and Patient being monitored Patient Re-evaluated:Patient Re-evaluated prior to induction Oxygen Delivery Method: Nasal cannula Induction Type: IV induction Placement Confirmation: positive ETCO2 Comments: Optiflow High Flow [place] O2 used.

## 2023-08-24 ENCOUNTER — Other Ambulatory Visit: Payer: Self-pay

## 2023-08-24 ENCOUNTER — Encounter (INDEPENDENT_AMBULATORY_CARE_PROVIDER_SITE_OTHER): Payer: Self-pay | Admitting: *Deleted

## 2023-08-24 ENCOUNTER — Encounter (HOSPITAL_COMMUNITY): Payer: Self-pay | Admitting: Gastroenterology

## 2023-08-24 LAB — SURGICAL PATHOLOGY

## 2023-08-24 MED ORDER — VALACYCLOVIR HCL 1 G PO TABS
1000.0000 mg | ORAL_TABLET | Freq: Three times a day (TID) | ORAL | 0 refills | Status: AC
  Filled 2023-08-24: qty 21, 7d supply, fill #0

## 2023-08-24 NOTE — Anesthesia Postprocedure Evaluation (Signed)
 Anesthesia Post Note  Patient: SHALIMAR MCCLAIN  Procedure(s) Performed: COLONOSCOPY WITH PROPOFOL ESOPHAGOGASTRODUODENOSCOPY (EGD) WITH PROPOFOL EGD, WITH DILATION USING SAVARY-GILLIARD DILATOR OVER GUIDEWIRE  Patient location during evaluation: Phase II Anesthesia Type: General Level of consciousness: awake Pain management: pain level controlled Vital Signs Assessment: post-procedure vital signs reviewed and stable Respiratory status: spontaneous breathing and respiratory function stable Cardiovascular status: blood pressure returned to baseline and stable Postop Assessment: no headache and no apparent nausea or vomiting Anesthetic complications: no Comments: Late entry   No notable events documented.   Last Vitals:  Vitals:   08/21/23 1306 08/21/23 1310  BP: 100/65 (!) 105/90  Pulse: (!) 57   Resp:    Temp: 36.4 C   SpO2: 98%     Last Pain:  Vitals:   08/24/23 1323  TempSrc:   PainSc: 0-No pain                 Windell Norfolk

## 2023-08-25 ENCOUNTER — Other Ambulatory Visit: Payer: Self-pay

## 2023-08-27 ENCOUNTER — Encounter (INDEPENDENT_AMBULATORY_CARE_PROVIDER_SITE_OTHER): Payer: Self-pay | Admitting: *Deleted

## 2023-09-02 DIAGNOSIS — G4733 Obstructive sleep apnea (adult) (pediatric): Secondary | ICD-10-CM | POA: Diagnosis not present

## 2023-09-07 ENCOUNTER — Other Ambulatory Visit: Payer: Self-pay

## 2023-09-08 ENCOUNTER — Other Ambulatory Visit: Payer: Self-pay

## 2023-09-08 MED ORDER — LOSARTAN POTASSIUM 100 MG PO TABS
100.0000 mg | ORAL_TABLET | Freq: Every morning | ORAL | 2 refills | Status: AC
  Filled 2024-03-08: qty 90, 90d supply, fill #0
  Filled 2024-05-31 – 2024-06-21 (×2): qty 90, 90d supply, fill #1

## 2023-09-08 MED ORDER — LOSARTAN POTASSIUM 100 MG PO TABS
100.0000 mg | ORAL_TABLET | ORAL | 2 refills | Status: DC
  Filled 2023-09-08: qty 90, 90d supply, fill #0
  Filled 2023-12-14: qty 90, 90d supply, fill #1

## 2023-09-09 ENCOUNTER — Other Ambulatory Visit: Payer: Self-pay

## 2023-09-21 ENCOUNTER — Other Ambulatory Visit: Payer: Self-pay

## 2023-09-21 DIAGNOSIS — R52 Pain, unspecified: Secondary | ICD-10-CM | POA: Diagnosis not present

## 2023-09-21 DIAGNOSIS — I10 Essential (primary) hypertension: Secondary | ICD-10-CM | POA: Diagnosis not present

## 2023-09-21 DIAGNOSIS — M545 Low back pain, unspecified: Secondary | ICD-10-CM | POA: Diagnosis not present

## 2023-09-21 DIAGNOSIS — Z6831 Body mass index (BMI) 31.0-31.9, adult: Secondary | ICD-10-CM | POA: Diagnosis not present

## 2023-09-21 DIAGNOSIS — Z299 Encounter for prophylactic measures, unspecified: Secondary | ICD-10-CM | POA: Diagnosis not present

## 2023-09-21 MED ORDER — BACLOFEN 10 MG PO TABS
10.0000 mg | ORAL_TABLET | Freq: Two times a day (BID) | ORAL | 0 refills | Status: DC
  Filled 2023-09-21: qty 14, 7d supply, fill #0

## 2023-09-22 ENCOUNTER — Other Ambulatory Visit: Payer: Self-pay

## 2023-09-22 MED ORDER — HYDROCHLOROTHIAZIDE 25 MG PO TABS
25.0000 mg | ORAL_TABLET | Freq: Every morning | ORAL | 3 refills | Status: DC
  Filled 2023-09-22: qty 90, 90d supply, fill #0
  Filled 2023-12-14: qty 90, 90d supply, fill #1

## 2023-09-22 MED ORDER — BACLOFEN 10 MG PO TABS
10.0000 mg | ORAL_TABLET | Freq: Three times a day (TID) | ORAL | 0 refills | Status: DC | PRN
  Filled 2023-09-22: qty 21, 7d supply, fill #0

## 2023-09-23 ENCOUNTER — Other Ambulatory Visit: Payer: Self-pay

## 2023-10-03 DIAGNOSIS — G4733 Obstructive sleep apnea (adult) (pediatric): Secondary | ICD-10-CM | POA: Diagnosis not present

## 2023-10-12 DIAGNOSIS — G4733 Obstructive sleep apnea (adult) (pediatric): Secondary | ICD-10-CM | POA: Diagnosis not present

## 2023-10-19 ENCOUNTER — Encounter: Payer: Self-pay | Admitting: Internal Medicine

## 2023-11-03 ENCOUNTER — Other Ambulatory Visit: Payer: Self-pay

## 2023-11-03 MED ORDER — AZELAIC ACID 15 % EX GEL
1.0000 | Freq: Every day | CUTANEOUS | 0 refills | Status: AC | PRN
  Filled 2023-11-03: qty 50, 30d supply, fill #0

## 2023-11-05 ENCOUNTER — Other Ambulatory Visit: Payer: Self-pay

## 2023-11-18 ENCOUNTER — Other Ambulatory Visit: Payer: Self-pay

## 2023-11-19 ENCOUNTER — Other Ambulatory Visit: Payer: Self-pay

## 2023-11-19 MED ORDER — CLONIDINE HCL 0.1 MG PO TABS
0.2000 mg | ORAL_TABLET | Freq: Two times a day (BID) | ORAL | 2 refills | Status: DC
  Filled 2023-11-19: qty 120, 30d supply, fill #0
  Filled 2023-12-14: qty 120, 30d supply, fill #1

## 2023-11-19 MED ORDER — PREDNISONE 10 MG PO TABS
10.0000 mg | ORAL_TABLET | Freq: Two times a day (BID) | ORAL | 0 refills | Status: DC
  Filled 2023-11-19: qty 20, 10d supply, fill #0

## 2023-11-19 MED ORDER — ROSUVASTATIN CALCIUM 20 MG PO TABS
20.0000 mg | ORAL_TABLET | Freq: Every day | ORAL | 1 refills | Status: DC
  Filled 2023-11-19: qty 90, 90d supply, fill #0

## 2023-11-19 MED ORDER — CLONIDINE HCL 0.1 MG PO TABS
0.2000 mg | ORAL_TABLET | Freq: Two times a day (BID) | ORAL | 2 refills | Status: AC
  Filled 2023-11-19: qty 360, 180d supply, fill #0
  Filled 2023-11-19 (×2): qty 360, 90d supply, fill #0
  Filled 2023-11-19: qty 360, 180d supply, fill #0
  Filled 2023-11-19: qty 360, 90d supply, fill #0
  Filled 2023-11-19: qty 360, 180d supply, fill #0
  Filled 2024-01-18: qty 360, 90d supply, fill #0
  Filled 2024-04-19: qty 360, 90d supply, fill #1
  Filled 2024-07-17: qty 360, 90d supply, fill #2
  Filled 2024-07-18: qty 348, 87d supply, fill #0
  Filled 2024-07-18: qty 12, 3d supply, fill #0

## 2023-11-19 MED ORDER — METOPROLOL SUCCINATE ER 25 MG PO TB24
ORAL_TABLET | ORAL | 1 refills | Status: DC
  Filled 2023-11-19 (×2): qty 270, 90d supply, fill #0

## 2023-11-20 ENCOUNTER — Other Ambulatory Visit: Payer: Self-pay

## 2023-11-20 MED ORDER — ROSUVASTATIN CALCIUM 20 MG PO TABS
20.0000 mg | ORAL_TABLET | Freq: Every day | ORAL | 1 refills | Status: AC
  Filled 2024-02-22 (×2): qty 90, 90d supply, fill #0
  Filled 2024-05-31: qty 90, 90d supply, fill #1

## 2023-11-20 MED ORDER — ROSUVASTATIN CALCIUM 20 MG PO TABS: 20.0000 mg | ORAL_TABLET | Freq: Every day | ORAL | 1 refills | Status: DC

## 2023-12-10 ENCOUNTER — Other Ambulatory Visit: Payer: Self-pay | Admitting: Nurse Practitioner

## 2023-12-10 DIAGNOSIS — Z1231 Encounter for screening mammogram for malignant neoplasm of breast: Secondary | ICD-10-CM

## 2023-12-14 ENCOUNTER — Other Ambulatory Visit: Payer: Self-pay

## 2023-12-14 MED ORDER — KETOCONAZOLE 2 % EX CREA
1.0000 | TOPICAL_CREAM | Freq: Two times a day (BID) | CUTANEOUS | 0 refills | Status: DC
  Filled 2023-12-14: qty 60, 30d supply, fill #0

## 2023-12-14 MED ORDER — THYROID 60 MG PO TABS
60.0000 mg | ORAL_TABLET | Freq: Every day | ORAL | 2 refills | Status: AC
  Filled 2023-12-14: qty 90, 90d supply, fill #0
  Filled 2024-03-08: qty 90, 90d supply, fill #1
  Filled 2024-06-21: qty 90, 90d supply, fill #2

## 2023-12-21 ENCOUNTER — Encounter: Payer: Self-pay | Admitting: *Deleted

## 2023-12-21 ENCOUNTER — Other Ambulatory Visit: Payer: Self-pay | Admitting: *Deleted

## 2023-12-21 DIAGNOSIS — D7389 Other diseases of spleen: Secondary | ICD-10-CM

## 2023-12-22 ENCOUNTER — Ambulatory Visit
Admission: RE | Admit: 2023-12-22 | Discharge: 2023-12-22 | Disposition: A | Discharge status: Home / Self Care | Source: Ambulatory Visit | Attending: Nurse Practitioner | Admitting: Nurse Practitioner

## 2023-12-22 DIAGNOSIS — Z1231 Encounter for screening mammogram for malignant neoplasm of breast: Secondary | ICD-10-CM | POA: Insufficient documentation

## 2023-12-25 DIAGNOSIS — E8940 Asymptomatic postprocedural ovarian failure: Secondary | ICD-10-CM | POA: Diagnosis not present

## 2023-12-26 ENCOUNTER — Ambulatory Visit (HOSPITAL_COMMUNITY)
Admission: RE | Admit: 2023-12-26 | Discharge: 2023-12-26 | Disposition: A | Discharge status: Home / Self Care | Source: Ambulatory Visit | Attending: Gastroenterology | Admitting: Gastroenterology

## 2023-12-26 DIAGNOSIS — D7389 Other diseases of spleen: Secondary | ICD-10-CM | POA: Diagnosis not present

## 2023-12-26 DIAGNOSIS — D1803 Hemangioma of intra-abdominal structures: Secondary | ICD-10-CM | POA: Diagnosis not present

## 2023-12-26 MED ORDER — GADOBUTROL 1 MMOL/ML IV SOLN
7.5000 mL | Freq: Once | INTRAVENOUS | Status: AC | PRN
  Administered 2023-12-26: 7.5 mL via INTRAVENOUS

## 2023-12-28 ENCOUNTER — Ambulatory Visit: Payer: Self-pay | Admitting: Gastroenterology

## 2023-12-28 ENCOUNTER — Other Ambulatory Visit: Payer: Self-pay

## 2023-12-28 MED ORDER — SUCRALFATE 1 G PO TABS
1.0000 g | ORAL_TABLET | Freq: Three times a day (TID) | ORAL | 0 refills | Status: DC
  Filled 2023-12-28: qty 30, 10d supply, fill #0

## 2023-12-31 DIAGNOSIS — I10 Essential (primary) hypertension: Secondary | ICD-10-CM | POA: Diagnosis not present

## 2023-12-31 DIAGNOSIS — E039 Hypothyroidism, unspecified: Secondary | ICD-10-CM | POA: Diagnosis not present

## 2023-12-31 DIAGNOSIS — Z299 Encounter for prophylactic measures, unspecified: Secondary | ICD-10-CM | POA: Diagnosis not present

## 2024-01-04 ENCOUNTER — Other Ambulatory Visit: Payer: Self-pay

## 2024-01-04 MED ORDER — SCOPOLAMINE 1 MG/3DAYS TD PT72
1.0000 | MEDICATED_PATCH | TRANSDERMAL | 0 refills | Status: DC
  Filled 2024-01-04: qty 3, 9d supply, fill #0

## 2024-01-07 ENCOUNTER — Other Ambulatory Visit: Payer: Self-pay

## 2024-01-07 ENCOUNTER — Ambulatory Visit: Admitting: Gastroenterology

## 2024-01-07 ENCOUNTER — Encounter: Payer: Self-pay | Admitting: Gastroenterology

## 2024-01-07 VITALS — BP 125/75 | HR 75 | Temp 97.7°F | Ht 66.0 in | Wt 186.6 lb

## 2024-01-07 DIAGNOSIS — K219 Gastro-esophageal reflux disease without esophagitis: Secondary | ICD-10-CM | POA: Diagnosis not present

## 2024-01-07 DIAGNOSIS — R1031 Right lower quadrant pain: Secondary | ICD-10-CM | POA: Diagnosis not present

## 2024-01-07 DIAGNOSIS — Z8619 Personal history of other infectious and parasitic diseases: Secondary | ICD-10-CM | POA: Diagnosis not present

## 2024-01-07 DIAGNOSIS — K529 Noninfective gastroenteritis and colitis, unspecified: Secondary | ICD-10-CM

## 2024-01-07 DIAGNOSIS — K580 Irritable bowel syndrome with diarrhea: Secondary | ICD-10-CM

## 2024-01-07 MED ORDER — RIFAXIMIN 200 MG PO TABS
200.0000 mg | ORAL_TABLET | Freq: Three times a day (TID) | ORAL | 0 refills | Status: DC | PRN
  Filled 2024-01-07: qty 90, 30d supply, fill #0

## 2024-01-07 MED ORDER — RIFAXIMIN 550 MG PO TABS
550.0000 mg | ORAL_TABLET | Freq: Three times a day (TID) | ORAL | 0 refills | Status: AC
  Filled 2024-01-07 – 2024-01-12 (×2): qty 42, 14d supply, fill #0

## 2024-01-07 MED ORDER — DICYCLOMINE HCL 10 MG PO CAPS
10.0000 mg | ORAL_CAPSULE | Freq: Three times a day (TID) | ORAL | 1 refills | Status: DC | PRN
  Filled 2024-01-07: qty 30, 10d supply, fill #0
  Filled 2024-03-17: qty 30, 10d supply, fill #1

## 2024-01-07 NOTE — Progress Notes (Addendum)
 GI Office Note    Referring Provider: Rosamond Leta NOVAK, MD Primary Care Physician:  Rosamond Leta NOVAK, MD Primary Gastroenterologist: Toribio Rubins, MD  Date:  01/07/2024  ID:  Patricia Glover, DOB [birth year], MRN [id number]  Chief Complaint   Chief Complaint  Patient presents with   Follow-up    Here for f/u from EGD/Colonoscopy. Pt states that she is still having diarrhea and is here to discuss starting a new medication pending ins approval.    History of Present Illness  Patricia Glover is a 63 y.o. female with a history of anxiety, asthma, HTN, HLD, hypothyroidism, IBS, and diverticulitis presenting today with complaint of ongoing diarrhea.  EGD July 2013: -Normal esophagus -Normal examined stomach -Normal examined duodenum   Colonoscopy July 2013: -Normal TI, normal colonic mucosa without diverticulosis -Normal rectum -Small external hemorrhoids -Given dicyclomine  10 mg 3 times daily before meals and advised probiotic daily and to have a small bowel follow-through   Received referral to schedule colonoscopy for screening in October - questionnaire mailed.    Per referral paperwork from 1/23 - OV 1/22 - Patient reported abdominal pain for 5 days.  Noted concerns for IBS and problem last.  She was advised on diet, stress management, importance of hydration.  Patient reportedly had Flagyl  at home, did not take during her last flare stating it is rough on her.  She reported she would take the Flagyl  as the Cipro  did not take care of her issue.  Labs from November with mildly elevated glucose of 108, normal LFTs, creatinine, and potassium.  TSH within normal limits.  Lipid panel with elevated LDL and mildly elevated total cholesterol at 244, hemoglobin stable at 12.6.  She was given dicyclomine  to take 10 mg every 6 hours as needed for abdominal pain.  Last office visit 07/13/23.  Presented with diarrhea, had had about years prior.  Some occasional right lower quadrant pain.   Having 3-4 bowel movements a day, sometimes 1/day but usually [place] 6 in nature and some pieces of stool.  Sometimes pain is at night and wakes her up at bedtime.  Tried a brat diet.  Taking Imodium every other day to keep from having accidents and has to take it while traveling.  Minimal toilet tissue hematochezia about 2-3 times over the prior 2 months.  Having sharp stabbing pain that can last for hours in the right lower quadrant.  Had received Cipro  and Bentyl  by PCP and Bentyl  seem to be helping.  Reported history of C. difficile about 10 years prior due to possible exposure from the patient.  Admitted to reflux symptoms and taking omeprazole  20 mg before bed and sleeps with head elevated.  Also wears a CPAP.  Takes chewable Pepcid rarely. CT A/P with contrast ordered.  Increase dicyclomine  to 10 mg 3 times daily.  Advised her to finish her Cipro  course.  Schedule EGD with dilation and colonoscopy.  Advised omeprazole  40 mg once daily and dysphagia precautions and to avoid roughage.  Discussed low FODMAP diet and avoiding dairy products.  CT A/P with contrast 07/28/2023 IMPRESSION: 1. No acute abnormality in the abdomen or pelvis. Normal appendix. 2. Punctate nonobstructive left interpolar renal stone. 3. 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 4. 19 mm focus of enhancement in the spleen is nonspecific but favored to reflect a benign hemangioma. Consider more definitive assessment by  MRI abdomen with and without contrast.  Colonoscopy 08/21/2023: - Examined ileum normal - Rectum and colon appearing normal, random biopsies taken.  Biopsies negative for microscopic colitis. - Repeat colonoscopy in 10 years.  EGD 08/21/23: - Nothing to explain dysphagia s/p dilation and biopsies -Single localized spot in the gastric antrum noted, possible healed ulcer s/p biopsy. -Normal  examined duodenum. - Path : mild congestion in distal esophagus, mid esophageal biopsies negative for EOE.  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  Negative H. pylori.  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  Today:  Diarrhea depends on her routine. 1/2 imodium nightly and she thinks that may be dehydrating her despite increasing water  and if she forgets then she will have diarrhea as soon as she eats in the morning. Has had to quit coffee altogether almost. This morning had cherrios and cofffee and has gurgling and has 3 episodes this morning. PCP has agreed that it is likely IBS. She suggested going back on the bentyl .  Continues to be without any significant abdominal pain at this point, more so has urgency discomfort prior to bowel movement.  It is occurring after meals as well.  Trying to get off medication sand has been trying to lose weight as well.   Has tried cholestyramine in the past. Viberzi not an option give cholecystectomy.   Wt Readings from Last 5 Encounters:  01/07/24 186 lb 9.6 oz (84.6 kg)  08/19/23 185 lb 3 oz (84 kg)  07/13/23 185 lb 3.2 oz (84 kg)  08/27/22 182 lb 6 oz (82.7 kg)  02/17/14 175 lb 9.6 oz (79.7 kg)    Current Outpatient Medications  Medication Sig Dispense Refill   Azelaic Acid  15 % gel Apply 1 Application topically daily as needed. 50 g 0   budeson-glycopyrrolate -formoterol  (BREZTRI  AEROSPHERE) 160-9-4.8 MCG/ACT AERO Inhale 2 puffs by mouth twice daily. (Patient taking differently: Take 2 puffs by mouth 2 (two) times daily as needed.) 10.7 g 1   calcium  carbonate (OSCAL) 1500 (600 Ca) MG TABS tablet Take 1 tablet by mouth daily.     cloNIDine  (CATAPRES ) 0.1 MG tablet Take 2 tablets (0.2 mg total) by mouth 2 (two) times daily. 360 tablet 2   co-enzyme Q-10 30 MG capsule Take 30 mg by mouth 3 (three) times daily.     dicyclomine  (BENTYL ) 10 MG  capsule Take 1 capsule (10 mg total) by mouth every 6 (six) hours. 40 capsule 0   DULoxetine  (CYMBALTA ) 60 MG capsule Take 1 capsule (60 mg total) by mouth in the morning. 90 capsule 3   famotidine (PEPCID) 40 MG tablet TAKE 1 TABLET BY MOUTH DAILY 30 tablet 1   hydrochlorothiazide  (HYDRODIURIL ) 25 MG tablet Take 1 tablet (25 mg total) by mouth every morning. 90 tablet 3   ketoconazole  (NIZORAL ) 2 % cream Apply 1 gram topically to affected area 2 (two) times daily. 60 g 0   losartan  (COZAAR ) 100 MG tablet Take 1 tablet (100 mg total) by mouth in the morning. 90 tablet 2   MAGNESIUM BISGLYCINATE PO Take by mouth.     metoprolol  succinate (TOPROL -XL) 25 MG 24 hr tablet Take 25 mg by mouth in the morning and at bedtime.     omeprazole  (PRILOSEC) 40 MG capsule Take 1 capsule (40 mg total) by mouth daily. 90 capsule 3   ondansetron  (ZOFRAN ) 4 MG tablet Take 1 tablet (4 mg total) by mouth 4 (four)  times daily as needed. 40 tablet 1   rosuvastatin  (CRESTOR ) 20 MG tablet Take 1 tablet (20 mg total) by mouth daily. 90 tablet 1   scopolamine  (TRANSDERM-SCOP) 1 MG/3DAYS Place 1 patch (1.5 mg total) behind ear every 3 (three) days as needed. Apply 4 hours prior to traveling (Patient taking differently: Place 1 patch onto the skin daily as needed.) 3 patch 0   SUMAtriptan  (IMITREX ) 100 MG tablet 1 (one) Tablet as directed prn migraine headache (Patient taking differently: Take 100 mg by mouth daily as needed.) 10 tablet 5   thyroid  (ARMOUR THYROID ) 60 MG tablet Take 1 tablet (60 mg total) by mouth daily. 90 tablet 2   valACYclovir  (VALTREX ) 1000 MG tablet Take 1 tablet (1,000 mg total) by mouth 3 (three) times daily. (Patient taking differently: Take 1,000 mg by mouth 3 (three) times daily as needed.) 21 tablet 0   VITAMIN D, CHOLECALCIFEROL, PO Take 15,000 Units by mouth daily.     Omega-3 1000 MG CAPS Take by mouth. (Patient not taking: Reported on 01/07/2024)     rifaximin  (XIFAXAN ) 200 MG tablet Take 1 tablet  (200 mg total) by mouth 3 (three) times daily as needed. (Patient not taking: Reported on 01/07/2024) 90 tablet 0   No current facility-administered medications for this visit.    Past Medical History:  Diagnosis Date   Complication of anesthesia    nausea, vomiting   Hypothyroid    Migraines     Past Surgical History:  Procedure Laterality Date   CHOLECYSTECTOMY     COLONOSCOPY WITH PROPOFOL  N/A 08/21/2023   Procedure: COLONOSCOPY WITH PROPOFOL ;  Surgeon: Eartha Angelia Sieving, MD;  [facility];  Service: Gastroenterology;  Laterality: N/A;  12:45 pm,asa 3   ESOPHAGOGASTRODUODENOSCOPY (EGD) WITH PROPOFOL  N/A 08/21/2023   Procedure: ESOPHAGOGASTRODUODENOSCOPY (EGD) WITH PROPOFOL ;  Surgeon: Eartha Angelia Sieving, MD;  Location: [facility];  Service: Gastroenterology;  Laterality: N/A;   KNEE SURGERY     PILONIDAL CYST EXCISION     35 yrs ago   SAVORY DILATION  08/21/2023   Procedure: EGD, WITH DILATION USING SAVARY-GILLIARD DILATOR OVER GUIDEWIRE;  Surgeon: Eartha Angelia, Sieving, MD;  Location: [facility];  Service: Gastroenterology;;   TOTAL ABDOMINAL HYSTERECTOMY      Family History  Problem Relation Age of Onset   Hyperlipidemia Mother    Malignant hypertension Mother    Asthma Sister    Stomach cancer Maternal Grandmother    Heart attack Maternal Grandfather 48   Breast cancer Neg Hx     Allergies as of 01/07/2024 - Review Complete 01/07/2024  Allergen Reaction Noted   Meperidine hcl Nausea And Vomiting 08/27/2022   Morphine Nausea And Vomiting 12/23/2011   Morphine and codeine Nausea And Vomiting 12/23/2011   Meperidine Nausea And Vomiting 12/23/2011    Social History   Socioeconomic History   Marital status: Married    Spouse name: Not on file   Number of children: Not on file   Years of education: Not on file   Highest education level: Not on file  Occupational History   Not on file  Tobacco Use   Smoking status: Never   Smokeless  tobacco: Not on file  Vaping Use   Vaping status: Never Used  Substance and Sexual Activity   Alcohol use: Yes    Comment: occassionally,   red wine   Drug use: No   Sexual activity: Yes  Other Topics Concern   Not on file  Social  History Narrative   Not on file   Social Drivers of Health   Financial Resource Strain: Not on file  Food Insecurity: Not on file  Transportation Needs: Not on file  Physical Activity: Not on file  Stress: Not on file  Social Connections: Not on file     Review of Systems   Gen: Denies fever, chills, anorexia. Denies fatigue, weakness, weight loss.  CV: Denies chest pain, palpitations, syncope, peripheral edema, and claudication. Resp: Denies dyspnea at rest, cough, wheezing, coughing up blood, and pleurisy. GI: See HPI Derm: Denies rash, itching, dry skin Psych: Denies depression, anxiety, memory loss, confusion. No homicidal or suicidal ideation.  Heme: Denies bruising, bleeding, and enlarged lymph nodes.  Physical Exam   BP 125/75 (BP Location: Right Arm, Patient Position: Sitting, Cuff Size: Normal)   Pulse 75   Temp 97.7 F (36.5 C) (Oral)   Ht 5' 6 (1.676 m)   Wt 186 lb 9.6 oz (84.6 kg)   SpO2 94%   BMI 30.12 kg/m   General:   Alert and oriented. No distress noted. Pleasant and cooperative.  Head:  Normocephalic and atraumatic. Eyes:  Conjuctiva clear without scleral icterus. Mouth:  Oral mucosa pink and moist. Good dentition. No lesions. Abdomen:  +BS, soft, non-tender and non-distended. No rebound or guarding. No HSM or masses noted. Rectal: deferred Msk:  Symmetrical without gross deformities. Normal posture. Extremities:  Without edema. Neurologic:  Alert and  oriented x4 Psych:  Alert and cooperative. Normal mood and affect.  Assessment  Patricia Glover is a 63 y.o. female presenting today to discuss ongoing diarrhea.   Chronic diarrhea, RLQ pain: - Recent colonoscopy with biopsies negative for microscopic colitis -  Has had history of C. difficile treated in the past. - Has tried antibiotics such as Flagyl  and Cipro  without any improvement. - Having urgency primarily after meals.  Also with history of cholecystectomy, failed trial of cholestyramine in the past. - Continues to have diarrhea, definitely is worse if she forgets her half dose Imodium nightly.  Coffee and roughage definitely worsens diarrhea. - Currently remains without any significant abdominal pain however does have urgency and discomfort prior to bowel movements. - Has used Bentyl  in the past, discussed trying this again for now and will start the authorization process for Xifaxan .  In the past Bentyl  has not been significantly helpful in treating diarrhea - Per review of chart, cannot find documentation of fecal elastase testing therefore can assess for EPI if ongoing symptoms despite Xifaxan   GERD: Fairly well-controlled with omeprazole  40 mg once daily.  Recent MRI revealed a benign splenic hemangioma which was previously seen on CT scan.  No concerning features at this time.  PLAN   Trial bentyl  1-3 times daily for diarrhea and start authorization for Xifaxin Imodium for refractory diarrhea only, stop nightly use.  Due for colonoscopy in 10 years, placed on recall for 2035. Continue omeprazole  40 mg daily Decrease roughage.  If unable to obtain Xifaxan  then may also obtain fecal elastase to assess for EPI. Follow up 3 months.    Charmaine Melia, MSN, FNP-BC, AGACNP-BC [facility]  I have reviewed the note and agree with the APP's assessment as described in this progress note  Toribio Fortune, MD Gastroenterology and Hepatology [facility]

## 2024-01-07 NOTE — Patient Instructions (Signed)
 As we discussed I want you to wean off the Imodium and start Bentyl  10 mg once daily.  If you feel like this is starting to help but need more you may increase to 2-3 times a day.  Please take at least a dose effective as possible to have a daily soft formed bowel movement without constipation.  Continue adequate water  intake.  I have refilled your dicyclomine  and I have submitted Xifaxan  again for prior authorization.  Whenever you find your paperwork you may semi a copy via MyChart or drop off a copy to the office if there is any information in the denial about trying and failing certain medications before approval will be given.  Plan follow-up in the office in 3 months or sooner if needed.  It was a pleasure to see you today. I want to create trusting relationships with patients. If you receive a survey regarding your visit,  I greatly appreciate you taking time to fill this out on paper or through your MyChart. I value your feedback.  Charmaine Melia, MSN, FNP-BC, AGACNP-BC [facility]

## 2024-01-11 DIAGNOSIS — G4733 Obstructive sleep apnea (adult) (pediatric): Secondary | ICD-10-CM | POA: Diagnosis not present

## 2024-01-12 ENCOUNTER — Other Ambulatory Visit: Payer: Self-pay

## 2024-01-18 ENCOUNTER — Other Ambulatory Visit: Payer: Self-pay

## 2024-01-18 MED ORDER — HYDROCHLOROTHIAZIDE 25 MG PO TABS
25.0000 mg | ORAL_TABLET | ORAL | 3 refills | Status: AC
  Filled 2024-01-18 – 2024-03-08 (×2): qty 90, 90d supply, fill #0
  Filled 2024-04-19 – 2024-06-21 (×2): qty 90, 90d supply, fill #1

## 2024-01-18 MED ORDER — HYDROCHLOROTHIAZIDE 25 MG PO TABS
25.0000 mg | ORAL_TABLET | Freq: Every morning | ORAL | 3 refills | Status: AC
  Filled 2024-03-08: qty 90, 90d supply, fill #0

## 2024-01-19 ENCOUNTER — Other Ambulatory Visit: Payer: Self-pay

## 2024-02-04 ENCOUNTER — Other Ambulatory Visit (HOSPITAL_BASED_OUTPATIENT_CLINIC_OR_DEPARTMENT_OTHER): Payer: Self-pay

## 2024-02-04 MED ORDER — GABAPENTIN 300 MG PO CAPS
300.0000 mg | ORAL_CAPSULE | Freq: Every day | ORAL | 0 refills | Status: DC
  Filled 2024-02-04: qty 30, 30d supply, fill #0

## 2024-02-22 ENCOUNTER — Other Ambulatory Visit: Payer: Self-pay

## 2024-02-22 ENCOUNTER — Other Ambulatory Visit (HOSPITAL_COMMUNITY): Payer: Self-pay

## 2024-02-25 ENCOUNTER — Other Ambulatory Visit (HOSPITAL_BASED_OUTPATIENT_CLINIC_OR_DEPARTMENT_OTHER): Payer: Self-pay

## 2024-02-25 ENCOUNTER — Encounter: Payer: Self-pay | Admitting: Gastroenterology

## 2024-02-25 DIAGNOSIS — R200 Anesthesia of skin: Secondary | ICD-10-CM | POA: Diagnosis not present

## 2024-02-25 DIAGNOSIS — M549 Dorsalgia, unspecified: Secondary | ICD-10-CM | POA: Diagnosis not present

## 2024-02-25 DIAGNOSIS — I10 Essential (primary) hypertension: Secondary | ICD-10-CM | POA: Diagnosis not present

## 2024-02-25 DIAGNOSIS — Z299 Encounter for prophylactic measures, unspecified: Secondary | ICD-10-CM | POA: Diagnosis not present

## 2024-02-25 DIAGNOSIS — R52 Pain, unspecified: Secondary | ICD-10-CM | POA: Diagnosis not present

## 2024-02-25 MED ORDER — GABAPENTIN 300 MG PO CAPS
300.0000 mg | ORAL_CAPSULE | Freq: Two times a day (BID) | ORAL | 0 refills | Status: DC
  Filled 2024-02-25 (×2): qty 60, 30d supply, fill #0
  Filled ????-??-??: fill #0

## 2024-02-26 ENCOUNTER — Ambulatory Visit
Admission: RE | Admit: 2024-02-26 | Discharge: 2024-02-26 | Disposition: A | Discharge status: Home / Self Care | Source: Ambulatory Visit | Attending: Nurse Practitioner | Admitting: Nurse Practitioner

## 2024-02-26 ENCOUNTER — Other Ambulatory Visit: Payer: Self-pay | Admitting: Nurse Practitioner

## 2024-02-26 DIAGNOSIS — R202 Paresthesia of skin: Secondary | ICD-10-CM | POA: Insufficient documentation

## 2024-02-26 DIAGNOSIS — R52 Pain, unspecified: Secondary | ICD-10-CM | POA: Insufficient documentation

## 2024-02-26 DIAGNOSIS — R200 Anesthesia of skin: Secondary | ICD-10-CM | POA: Diagnosis not present

## 2024-02-26 DIAGNOSIS — M2560 Stiffness of unspecified joint, not elsewhere classified: Secondary | ICD-10-CM

## 2024-02-29 ENCOUNTER — Other Ambulatory Visit: Payer: Self-pay

## 2024-02-29 MED ORDER — KETOCONAZOLE 2 % EX CREA
TOPICAL_CREAM | Freq: Two times a day (BID) | CUTANEOUS | 0 refills | Status: AC
  Filled 2024-02-29: qty 60, 30d supply, fill #0

## 2024-03-08 ENCOUNTER — Other Ambulatory Visit: Payer: Self-pay

## 2024-03-08 ENCOUNTER — Other Ambulatory Visit (HOSPITAL_BASED_OUTPATIENT_CLINIC_OR_DEPARTMENT_OTHER): Payer: Self-pay

## 2024-03-24 ENCOUNTER — Other Ambulatory Visit: Payer: Self-pay

## 2024-03-24 ENCOUNTER — Other Ambulatory Visit (HOSPITAL_BASED_OUTPATIENT_CLINIC_OR_DEPARTMENT_OTHER): Payer: Self-pay

## 2024-03-24 MED ORDER — METOPROLOL SUCCINATE ER 25 MG PO TB24
25.0000 mg | ORAL_TABLET | Freq: Three times a day (TID) | ORAL | 1 refills | Status: AC
  Filled 2024-03-24: qty 270, 90d supply, fill #0

## 2024-03-30 ENCOUNTER — Encounter (INDEPENDENT_AMBULATORY_CARE_PROVIDER_SITE_OTHER): Payer: Self-pay | Admitting: Gastroenterology

## 2024-03-31 ENCOUNTER — Other Ambulatory Visit (HOSPITAL_BASED_OUTPATIENT_CLINIC_OR_DEPARTMENT_OTHER): Payer: Self-pay

## 2024-04-06 DIAGNOSIS — Z299 Encounter for prophylactic measures, unspecified: Secondary | ICD-10-CM | POA: Diagnosis not present

## 2024-04-06 DIAGNOSIS — R5383 Other fatigue: Secondary | ICD-10-CM | POA: Diagnosis not present

## 2024-04-06 DIAGNOSIS — Z0000 Encounter for general adult medical examination without abnormal findings: Secondary | ICD-10-CM | POA: Diagnosis not present

## 2024-04-06 DIAGNOSIS — E538 Deficiency of other specified B group vitamins: Secondary | ICD-10-CM | POA: Diagnosis not present

## 2024-04-06 DIAGNOSIS — M8580 Other specified disorders of bone density and structure, unspecified site: Secondary | ICD-10-CM | POA: Diagnosis not present

## 2024-04-06 DIAGNOSIS — I10 Essential (primary) hypertension: Secondary | ICD-10-CM | POA: Diagnosis not present

## 2024-04-06 DIAGNOSIS — Z1331 Encounter for screening for depression: Secondary | ICD-10-CM | POA: Diagnosis not present

## 2024-04-06 DIAGNOSIS — E039 Hypothyroidism, unspecified: Secondary | ICD-10-CM | POA: Diagnosis not present

## 2024-04-07 ENCOUNTER — Other Ambulatory Visit (HOSPITAL_BASED_OUTPATIENT_CLINIC_OR_DEPARTMENT_OTHER): Payer: Self-pay

## 2024-04-07 MED ORDER — CYANOCOBALAMIN 1000 MCG/ML IJ SOLN
INTRAMUSCULAR | 2 refills | Status: AC
  Filled 2024-04-07: qty 6, 42d supply, fill #0
  Filled 2024-07-05: qty 6, 42d supply, fill #1

## 2024-04-12 ENCOUNTER — Other Ambulatory Visit (HOSPITAL_BASED_OUTPATIENT_CLINIC_OR_DEPARTMENT_OTHER): Payer: Self-pay

## 2024-04-12 MED ORDER — SUCRALFATE 1 G PO TABS
1.0000 g | ORAL_TABLET | Freq: Three times a day (TID) | ORAL | 0 refills | Status: AC
Start: 2024-04-12 — End: ?
  Filled 2024-04-12: qty 30, 10d supply, fill #0

## 2024-04-19 ENCOUNTER — Other Ambulatory Visit: Payer: Self-pay | Admitting: Gastroenterology

## 2024-04-19 ENCOUNTER — Other Ambulatory Visit: Payer: Self-pay

## 2024-04-19 MED ORDER — DICYCLOMINE HCL 10 MG PO CAPS
10.0000 mg | ORAL_CAPSULE | Freq: Three times a day (TID) | ORAL | 1 refills | Status: AC | PRN
  Filled 2024-04-19: qty 30, 10d supply, fill #0

## 2024-04-19 MED ORDER — GABAPENTIN 300 MG PO CAPS
300.0000 mg | ORAL_CAPSULE | Freq: Two times a day (BID) | ORAL | 0 refills | Status: AC
  Filled 2024-04-19: qty 60, 30d supply, fill #0

## 2024-05-13 ENCOUNTER — Other Ambulatory Visit (HOSPITAL_BASED_OUTPATIENT_CLINIC_OR_DEPARTMENT_OTHER): Payer: Self-pay

## 2024-05-13 MED ORDER — CEPHALEXIN 500 MG PO CAPS
500.0000 mg | ORAL_CAPSULE | Freq: Two times a day (BID) | ORAL | 0 refills | Status: AC
  Filled 2024-05-13: qty 20, 10d supply, fill #0
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
## 2024-05-31 ENCOUNTER — Other Ambulatory Visit (HOSPITAL_BASED_OUTPATIENT_CLINIC_OR_DEPARTMENT_OTHER): Payer: Self-pay

## 2024-05-31 MED ORDER — DULOXETINE HCL 60 MG PO CPEP
60.0000 mg | ORAL_CAPSULE | Freq: Every morning | ORAL | 3 refills | Status: AC
  Filled 2024-05-31: qty 90, 90d supply, fill #0

## 2024-06-02 ENCOUNTER — Other Ambulatory Visit (HOSPITAL_BASED_OUTPATIENT_CLINIC_OR_DEPARTMENT_OTHER): Payer: Self-pay

## 2024-06-21 ENCOUNTER — Other Ambulatory Visit (HOSPITAL_BASED_OUTPATIENT_CLINIC_OR_DEPARTMENT_OTHER): Payer: Self-pay

## 2024-06-21 MED ORDER — LEVOTHYROXINE SODIUM 88 MCG PO TABS
88.0000 ug | ORAL_TABLET | Freq: Every day | ORAL | 1 refills | Status: AC
  Filled 2024-06-21: qty 90, 90d supply, fill #0

## 2024-07-05 ENCOUNTER — Other Ambulatory Visit (HOSPITAL_BASED_OUTPATIENT_CLINIC_OR_DEPARTMENT_OTHER): Payer: Self-pay

## 2024-07-18 ENCOUNTER — Other Ambulatory Visit (HOSPITAL_BASED_OUTPATIENT_CLINIC_OR_DEPARTMENT_OTHER): Payer: Self-pay

## 2024-07-18 ENCOUNTER — Other Ambulatory Visit: Payer: Self-pay

## 2024-07-19 ENCOUNTER — Other Ambulatory Visit (HOSPITAL_BASED_OUTPATIENT_CLINIC_OR_DEPARTMENT_OTHER): Payer: Self-pay

## 2024-07-19 MED ORDER — CYCLOBENZAPRINE HCL 10 MG PO TABS
10.0000 mg | ORAL_TABLET | Freq: Every day | ORAL | 1 refills | Status: AC
  Filled 2024-07-19: qty 30, 30d supply, fill #0

## 2024-07-19 MED ORDER — OMEPRAZOLE 40 MG PO CPDR
40.0000 mg | DELAYED_RELEASE_CAPSULE | Freq: Every day | ORAL | 1 refills | Status: AC
  Filled 2024-07-19: qty 90, 90d supply, fill #0
